# Patient Record
Sex: Female | Born: 1945
Health system: Southern US, Community
[De-identification: ages and names within clinical notes are randomized; demographics above are authoritative.]

## PROBLEM LIST (undated history)

## (undated) DIAGNOSIS — E7800 Pure hypercholesterolemia, unspecified: Secondary | ICD-10-CM

## (undated) DIAGNOSIS — I10 Essential (primary) hypertension: Secondary | ICD-10-CM

## (undated) DIAGNOSIS — E079 Disorder of thyroid, unspecified: Secondary | ICD-10-CM

## (undated) HISTORY — PX: CHOLECYSTECTOMY: SHX55

## (undated) HISTORY — PX: FUNCTIONAL ENDOSCOPIC SINUS SURGERY: SUR616

## (undated) HISTORY — PX: BILATERAL OOPHORECTOMY: SHX1221

## (undated) HISTORY — PX: OTHER SURGICAL HISTORY: SHX169

## (undated) HISTORY — PX: HERNIA REPAIR: SHX51

## (undated) HISTORY — PX: TONSILLECTOMY: SUR1361

## (undated) HISTORY — PX: ABDOMINAL HYSTERECTOMY: SHX81

---

## 2010-11-20 ENCOUNTER — Inpatient Hospital Stay (INDEPENDENT_AMBULATORY_CARE_PROVIDER_SITE_OTHER)
Admission: RE | Admit: 2010-11-20 | Discharge: 2010-11-20 | Disposition: A | Discharge status: Home / Self Care | Payer: Commercial Managed Care - PPO | Source: Ambulatory Visit | Attending: Family Medicine | Admitting: Family Medicine

## 2010-11-20 DIAGNOSIS — H6690 Otitis media, unspecified, unspecified ear: Secondary | ICD-10-CM

## 2011-09-07 ENCOUNTER — Emergency Department (INDEPENDENT_AMBULATORY_CARE_PROVIDER_SITE_OTHER)
Admission: EM | Admit: 2011-09-07 | Discharge: 2011-09-07 | Disposition: A | Discharge status: Home / Self Care | Payer: 59 | Source: Home / Self Care | Attending: Family Medicine | Admitting: Family Medicine

## 2011-09-07 ENCOUNTER — Emergency Department (INDEPENDENT_AMBULATORY_CARE_PROVIDER_SITE_OTHER): Payer: 59

## 2011-09-07 DIAGNOSIS — S93609A Unspecified sprain of unspecified foot, initial encounter: Secondary | ICD-10-CM

## 2011-09-07 HISTORY — DX: Pure hypercholesterolemia, unspecified: E78.00

## 2011-09-07 HISTORY — DX: Disorder of thyroid, unspecified: E07.9

## 2011-09-07 HISTORY — DX: Essential (primary) hypertension: I10

## 2011-09-07 MED ORDER — HYDROCODONE-ACETAMINOPHEN 5-325 MG PO TABS
ORAL_TABLET | ORAL | Status: AC
  Filled 2011-09-07: qty 2

## 2011-09-07 MED ORDER — HYDROCODONE-ACETAMINOPHEN 5-325 MG PO TABS: 1.0000 | ORAL_TABLET | Freq: Four times a day (QID) | ORAL | Status: AC | PRN

## 2011-09-07 MED ORDER — HYDROCODONE-ACETAMINOPHEN 5-325 MG PO TABS
2.0000 | ORAL_TABLET | Freq: Once | ORAL | Status: AC
  Administered 2011-09-07: 2 via ORAL

## 2011-09-07 MED ORDER — HYDROCODONE-ACETAMINOPHEN 5-325 MG PO TABS: 1.0000 | ORAL_TABLET | Freq: Once | ORAL | Status: DC

## 2011-09-07 NOTE — ED Notes (Signed)
States that earlier today, she slipped in grass , and twisted her left foot/ankle ; c/o pain foot from ankle/foot , good dorsal pulse, ice pack applied , swelling lateral and medical aspect of ankle

## 2011-09-07 NOTE — ED Notes (Signed)
Fitted w cam walker, demonstrated walking and unst of use; has family w her who will drive home

## 2011-09-07 NOTE — ED Provider Notes (Signed)
Medical screening examination/treatment/procedure(s) were performed by non-physician practitioner and as supervising physician I was immediately available for consultation/collaboration.   Barkley Bruns MD.    Barkley Bruns, MD 09/07/11 [phone]

## 2011-09-07 NOTE — ED Provider Notes (Signed)
History     CSN: [id number] Arrival date & time: 09/07/2011  2:06 PM   First MD Initiated Contact with Patient 09/07/11 1321      Chief Complaint  Patient presents with  . Foot Injury    (Consider location/radiation/quality/duration/timing/severity/associated sxs/prior treatment) Patient is a 65 y.o. female presenting with foot injury. The history is provided by the patient.  Foot Injury  The incident occurred 3 to 5 hours ago. The incident occurred at home. Injury mechanism: slid on grass and twisted foot laterally. The pain is present in the left foot. The quality of the pain is described as throbbing and aching. The pain is at a severity of 7/10. The pain is moderate. The pain has been constant since onset. Pertinent negatives include no numbness and no loss of sensation. The symptoms are aggravated by palpation, bearing weight and activity. She has tried nothing for the symptoms.    Past Medical History  Diagnosis Date  . Diabetes mellitus   . Hypertension   . Thyroid disease   . High cholesterol     History reviewed. No pertinent past surgical history.  History reviewed. No pertinent family history.  History  Substance Use Topics  . Smoking status: Never Smoker   . Smokeless tobacco: Not on file  . Alcohol Use: No    OB History    Grav Para Term Preterm Abortions TAB SAB Ect Mult Living                  Review of Systems  Constitutional: Negative for fever and chills.  Musculoskeletal:       L lateral foot pain  Skin: Negative for color change.  Neurological: Negative for weakness and numbness.    Allergies  Ciprofloxacin  Home Medications  No current outpatient prescriptions on file.  BP 181/82  Pulse 88  Temp(Src) 98.8 F (37.1 C) (Oral)  Resp 16  SpO2 97%  Physical Exam  Constitutional: She is oriented to person, place, and time. She appears well-developed and well-nourished. No distress.  HENT:  Head: Normocephalic and atraumatic.    Cardiovascular:  Pulses:      Dorsalis pedis pulses are 2+ on the left side.       Posterior tibial pulses are 2+ on the left side.  Pulmonary/Chest: Effort normal.  Musculoskeletal:       Left foot: She exhibits tenderness and swelling.       Feet:       No tenderness to palp L medial or lateral malleoli; area of pain is top of foot  Neurological: She is alert and oriented to person, place, and time. No sensory deficit.  Skin: No abrasion and no bruising noted.    ED Course  Procedures (including critical care time)  Labs Reviewed - No data to display No results found.   No diagnosis found.    MDM  No fx on x-ray, but pt c/o pain with movement of foot and ankle; requests cam walker and f/u with ortho;         Cathlyn Parsons, NP 09/07/11 [phone]

## 2011-11-06 ENCOUNTER — Emergency Department (INDEPENDENT_AMBULATORY_CARE_PROVIDER_SITE_OTHER)
Admission: EM | Admit: 2011-11-06 | Discharge: 2011-11-06 | Disposition: A | Discharge status: Home / Self Care | Payer: 59 | Source: Home / Self Care | Attending: Emergency Medicine | Admitting: Emergency Medicine

## 2011-11-06 ENCOUNTER — Encounter (HOSPITAL_COMMUNITY): Payer: Self-pay

## 2011-11-06 DIAGNOSIS — H6692 Otitis media, unspecified, left ear: Secondary | ICD-10-CM

## 2011-11-06 DIAGNOSIS — H6690 Otitis media, unspecified, unspecified ear: Secondary | ICD-10-CM

## 2011-11-06 DIAGNOSIS — J329 Chronic sinusitis, unspecified: Secondary | ICD-10-CM

## 2011-11-06 DIAGNOSIS — J45909 Unspecified asthma, uncomplicated: Secondary | ICD-10-CM

## 2011-11-06 MED ORDER — BECLOMETHASONE DIPROPIONATE 80 MCG/ACT IN AERS: 2.0000 | INHALATION_SPRAY | Freq: Two times a day (BID) | RESPIRATORY_TRACT | Status: DC

## 2011-11-06 MED ORDER — AMOXICILLIN-POT CLAVULANATE 875-125 MG PO TABS: 1.0000 | ORAL_TABLET | Freq: Two times a day (BID) | ORAL | Status: AC

## 2011-11-06 NOTE — ED Notes (Signed)
C/o head congestion, green nasal secretions and lt ear pain for 3 days.

## 2011-11-06 NOTE — ED Provider Notes (Signed)
Chief Complaint  Patient presents with  . Sinusitis    History of Present Illness:  Ronnette has had a three-day history of nasal congestion with green drainage with blood, sinus pressure, popping of the left ear, chills, and cough productive of green sputum. She denies any fever, sore throat, wheezing, chest pain, vomiting, or diarrhea. She does have a history of asthma and is on albuterol.  Review of Systems:  Other than noted above, the patient denies any of the following symptoms. Systemic:  No fever, chills, sweats, fatigue, myalgias, headache, or anorexia. Eye:  No redness, pain or drainage. ENT:  No earache, nasal congestion, rhinorrhea, sinus pressure, or sore throat. Lungs:  No cough, sputum production, wheezing, shortness of breath. Or chest pain. GI:  No nausea, vomiting, abdominal pain or diarrhea. Skin:  No rash or itching.  PMFSH:  Past medical history, family history, social history, meds, and allergies were reviewed.  Physical Exam:   Vital signs:  BP 138/81  Pulse 82  Temp(Src) 98.1 F (36.7 C) (Oral)  Resp 18  SpO2 97% General:  Alert, in no distress. Eye:  No conjunctival injection or drainage. ENT:  The left TM was pink, the right TM was normal. There was no fluid or pus behind either TM.  Nasal mucosa was clear and uncongested, without drainage.  Mucous membranes were moist.  Pharynx was clear, without exudate or drainage.  There were no oral ulcerations or lesions. Neck:  Supple, no adenopathy, tenderness or mass. Lungs:  No respiratory distress. She has scattered bilateral expiratory wheezes. No rales or rhonchi. Breath sounds are equal bilaterally.  Heart:  Regular rhythm, without gallops, murmers or rubs. Skin:  Clear, warm, and dry, without rash or lesions.  Labs:  No results found for this or any previous visit.   Radiology:  No results found.  Medications given in [facility]:    Assessment:   Diagnoses that have been ruled out:  None  Diagnoses that are  still under consideration:  None  Final diagnoses:  Sinusitis  Left otitis media  Asthma      Plan:   1.  The following meds were prescribed:   New Prescriptions   AMOXICILLIN-CLAVULANATE (AUGMENTIN) 875-125 MG PER TABLET    Take 1 tablet by mouth 2 (two) times daily.   BECLOMETHASONE (QVAR) 80 MCG/ACT INHALER    Inhale 2 puffs into the lungs 2 (two) times daily.   2.  The patient was instructed in symptomatic care and handouts were given. 3.  The patient was told to return if becoming worse in any way, if no better in 3 or 4 days, and given some red flag symptoms that would indicate earlier return.   Roque Lias, MD 11/06/11 [phone]

## 2013-01-19 ENCOUNTER — Emergency Department (HOSPITAL_COMMUNITY)
Admission: EM | Admit: 2013-01-19 | Discharge: 2013-01-19 | Disposition: A | Discharge status: Home / Self Care | Payer: PRIVATE HEALTH INSURANCE | Attending: Emergency Medicine | Admitting: Emergency Medicine

## 2013-01-19 ENCOUNTER — Emergency Department (HOSPITAL_COMMUNITY): Payer: PRIVATE HEALTH INSURANCE

## 2013-01-19 ENCOUNTER — Encounter (HOSPITAL_COMMUNITY): Payer: Self-pay | Admitting: Emergency Medicine

## 2013-01-19 DIAGNOSIS — E079 Disorder of thyroid, unspecified: Secondary | ICD-10-CM | POA: Insufficient documentation

## 2013-01-19 DIAGNOSIS — R110 Nausea: Secondary | ICD-10-CM | POA: Insufficient documentation

## 2013-01-19 DIAGNOSIS — S0003XA Contusion of scalp, initial encounter: Secondary | ICD-10-CM | POA: Insufficient documentation

## 2013-01-19 DIAGNOSIS — Y9301 Activity, walking, marching and hiking: Secondary | ICD-10-CM | POA: Insufficient documentation

## 2013-01-19 DIAGNOSIS — E7800 Pure hypercholesterolemia, unspecified: Secondary | ICD-10-CM | POA: Insufficient documentation

## 2013-01-19 DIAGNOSIS — I10 Essential (primary) hypertension: Secondary | ICD-10-CM | POA: Insufficient documentation

## 2013-01-19 DIAGNOSIS — Y990 Civilian activity done for income or pay: Secondary | ICD-10-CM | POA: Insufficient documentation

## 2013-01-19 DIAGNOSIS — J45909 Unspecified asthma, uncomplicated: Secondary | ICD-10-CM | POA: Insufficient documentation

## 2013-01-19 DIAGNOSIS — R739 Hyperglycemia, unspecified: Secondary | ICD-10-CM

## 2013-01-19 DIAGNOSIS — IMO0002 Reserved for concepts with insufficient information to code with codable children: Secondary | ICD-10-CM | POA: Insufficient documentation

## 2013-01-19 DIAGNOSIS — Z79899 Other long term (current) drug therapy: Secondary | ICD-10-CM | POA: Insufficient documentation

## 2013-01-19 DIAGNOSIS — Z7982 Long term (current) use of aspirin: Secondary | ICD-10-CM | POA: Insufficient documentation

## 2013-01-19 DIAGNOSIS — E1169 Type 2 diabetes mellitus with other specified complication: Secondary | ICD-10-CM | POA: Insufficient documentation

## 2013-01-19 DIAGNOSIS — S0093XA Contusion of unspecified part of head, initial encounter: Secondary | ICD-10-CM

## 2013-01-19 DIAGNOSIS — E876 Hypokalemia: Secondary | ICD-10-CM | POA: Insufficient documentation

## 2013-01-19 DIAGNOSIS — Y9289 Other specified places as the place of occurrence of the external cause: Secondary | ICD-10-CM | POA: Insufficient documentation

## 2013-01-19 LAB — POCT I-STAT, CHEM 8
BUN: 15 mg/dL (ref 6–23)
Chloride: 103 mEq/L (ref 96–112)
HCT: 41 % (ref 36.0–46.0)
Potassium: 3.3 mEq/L — ABNORMAL LOW (ref 3.5–5.1)
Sodium: 141 mEq/L (ref 135–145)

## 2013-01-19 MED ORDER — POTASSIUM CHLORIDE CRYS ER 20 MEQ PO TBCR
40.0000 meq | EXTENDED_RELEASE_TABLET | Freq: Once | ORAL | Status: AC
  Administered 2013-01-19: 40 meq via ORAL
  Filled 2013-01-19: qty 2

## 2013-01-19 NOTE — ED Notes (Signed)
Pt was walking into work and states that she was walking under a rail and hit her head while going under and fell had brief loc, alert x4 now did have emesis, headache

## 2013-01-19 NOTE — ED Provider Notes (Signed)
History     CSN: [id number]  Arrival date & time 01/19/13  4098   First MD Initiated Contact with Patient 01/19/13 [phone]      Chief Complaint  Patient presents with  . Fall    (Consider location/radiation/quality/duration/timing/severity/associated sxs/prior treatment) Patient is a 67 y.o. female presenting with fall. The history is provided by the patient.  Fall Associated symptoms include nausea and headaches. Pertinent negatives include no fever, no numbness and no abdominal pain.  pt was walking into work today, walked under a concrete structure 'to take shortcut' and hit head on the structure, +brief loc. +headache post contusion/head injury. Dull, moderate. Was able to get up under own power and go into work, episode nausea ?dry heaves. Nausea better now. stateswhen awoke today felt at baseline. Ate, got ready for work per normal routine. Notes neck pain worse w turning head side to side. No radicular pain. No numbness/weakness. No back pain. Denies other pain or injury.     Past Medical History  Diagnosis Date  . Diabetes mellitus   . Hypertension   . Thyroid disease   . High cholesterol   . Asthma     Past Surgical History  Procedure Laterality Date  . Tonsillectomy    . Adnoidectomy    . Hernia repair    . Cholecystectomy    . Functional endoscopic sinus surgery    . Bilateral oophorectomy    . Abdominal hysterectomy      No family history on file.  History  Substance Use Topics  . Smoking status: Never Smoker   . Smokeless tobacco: Not on file  . Alcohol Use: No    OB History   Grav Para Term Preterm Abortions TAB SAB Ect Mult Living                  Review of Systems  Constitutional: Negative for fever.  HENT: Positive for neck pain.   Eyes: Negative for visual disturbance.  Respiratory: Negative for shortness of breath.   Cardiovascular: Negative for chest pain and palpitations.  Gastrointestinal: Positive for nausea. Negative for abdominal  pain and blood in stool.  Genitourinary: Negative for flank pain.  Musculoskeletal: Negative for back pain.  Skin: Negative for wound.  Neurological: Positive for headaches. Negative for weakness and numbness.  Hematological: Does not bruise/bleed easily.  Psychiatric/Behavioral: Negative for confusion.    Allergies  Ciprofloxacin and Iodine  Home Medications   Current Outpatient Rx  Name  Route  Sig  Dispense  Refill  . ALBUTEROL IN   Inhalation   Inhale into the lungs as needed.         . ASPIRIN PO   Oral   Take by mouth.         . EXPIRED: beclomethasone (QVAR) 80 MCG/ACT inhaler   Inhalation   Inhale 2 puffs into the lungs 2 (two) times daily.   1 Inhaler   0   . cholecalciferol (VITAMIN D) 1000 UNITS tablet   Oral   Take 1,000 Units by mouth daily.           . Ezetimibe (ZETIA PO)   Oral   Take by mouth.         . hydrochlorothiazide (MICROZIDE) 12.5 MG capsule   Oral   Take 12.5 mg by mouth daily.           Marland Kitchen levothyroxine (SYNTHROID, LEVOTHROID) 125 MCG tablet   Oral   Take 125 mcg by mouth daily.           Marland Kitchen  lisinopril (PRINIVIL,ZESTRIL) 10 MG tablet   Oral   Take 10 mg by mouth daily.           . metFORMIN (GLUCOPHAGE) 500 MG tablet   Oral   Take 500 mg by mouth 2 (two) times daily with a meal.           . Rosuvastatin Calcium (CRESTOR PO)   Oral   Take by mouth.         . simvastatin (ZOCOR) 20 MG tablet   Oral   Take 20 mg by mouth at bedtime.           . topiramate (TOPAMAX) 50 MG tablet   Oral   Take 50 mg by mouth 2 (two) times daily.             BP 169/80  Pulse 116  Temp(Src) 98.6 F (37 C) (Oral)  Resp 16  SpO2 97%  Physical Exam  Nursing note and vitals reviewed. Constitutional: She is oriented to person, place, and time. She appears well-developed and well-nourished. No distress.  HENT:  Contusion scalp  Eyes: Conjunctivae are normal. Pupils are equal, round, and reactive to light. No scleral  icterus.  Neck: Neck supple. No tracheal deviation present.  Cardiovascular: Normal rate, regular rhythm, normal heart sounds and intact distal pulses.   Pulmonary/Chest: Effort normal and breath sounds normal. No respiratory distress. She exhibits no tenderness.  Abdominal: Soft. Normal appearance and bowel sounds are normal. She exhibits no distension. There is no tenderness.  Musculoskeletal: She exhibits no edema and no tenderness.  Mid cervical tenderness, otherwise CTLS spine, non tender, aligned, no step off.   Neurological: She is alert and oriented to person, place, and time.  Steady gait.   Skin: Skin is warm and dry. No rash noted.  Psychiatric: She has a normal mood and affect.    ED Course  Procedures (including critical care time)  Results for orders placed during the hospital encounter of 01/19/13  POCT I-STAT, CHEM 8      Result Value Range   Sodium 141  135 - 145 mEq/L   Potassium 3.3 (*) 3.5 - 5.1 mEq/L   Chloride 103  96 - 112 mEq/L   BUN 15  6 - 23 mg/dL   Creatinine, Ser 4.09  0.50 - 1.10 mg/dL   Glucose, Bld 811 (*) 70 - 99 mg/dL   Calcium, Ion 9.14 (*) 1.13 - 1.30 mmol/L   TCO2 27  0 - 100 mmol/L   Hemoglobin 13.9  12.0 - 15.0 g/dL   HCT 78.2  95.6 - 21.3 %   Ct Head Wo Contrast  01/19/2013  *RADIOLOGY REPORT*  Clinical Data:  Trauma with fall and loss of consciousness.  CT HEAD WITHOUT CONTRAST CT CERVICAL SPINE WITHOUT CONTRAST  Technique:  Multidetector CT imaging of the head and cervical spine was performed following the standard protocol without intravenous contrast.  Multiplanar CT image reconstructions of the cervical spine were also generated.  Comparison:   None  CT HEAD  Findings: The brain shows a background pattern of mild generalized atrophy and chronic appearing small vessel change within the white matter.  No sign of acute infarction, mass lesion, hemorrhage, hydrocephalus or extra-axial collection.  No skull fracture.  No fluid in the sinuses,  middle ears or mastoids.  There is an old medial wall orbital fracture on the right.  IMPRESSION: No acute finding.  Atrophy and chronic small vessel change of the white matter.  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 There is no fracture or malalignment in the cervical region.  Very minimal degenerative spondylosis at C5-6 without compressive narrowing of the canal or foramina.  Mild curvature convex to the left.  IMPRESSION: No acute or traumatic finding.  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   Original Report Authenticated By: Paulina Fusi, M.D.    Ct Cervical Spine Wo Contrast  01/19/2013  *RADIOLOGY REPORT*  Clinical Data:  Trauma with fall and loss of consciousness.  CT HEAD WITHOUT CONTRAST CT CERVICAL SPINE WITHOUT CONTRAST  Technique:  Multidetector CT imaging of the head and cervical spine was performed following the standard protocol without intravenous contrast.  Multiplanar CT image reconstructions of the cervical spine were also generated.  Comparison:   None  CT HEAD  Findings: The brain shows a background pattern of mild generalized atrophy and chronic appearing small vessel change within the white matter.  No sign of acute infarction, mass lesion, hemorrhage, hydrocephalus or extra-axial collection.  No skull fracture.  No fluid in the sinuses, middle ears or mastoids.  There is an old medial wall orbital fracture on the right.  IMPRESSION: No acute finding.  Atrophy and chronic small vessel change of the white matter.  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 There is no fracture or malalignment in the cervical region.  Very minimal degenerative spondylosis at C5-6 without compressive narrowing of the canal or foramina.  Mild curvature convex to the left.  IMPRESSION: No acute or traumatic finding.  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   Original Report Authenticated By: Paulina Fusi, M.D.         MDM  Cts.  Reviewed nursing notes and prior charts for additional history.    kcl po.  Discussed electrolyte/glucose results w pt.  Recheck pt comfortable. No nv. Declines any pain medication in ED.  Pt appears stable for d/c.          Suzi Roots, MD 01/19/13 1013

## 2014-06-25 IMAGING — CT CT CERVICAL SPINE W/O CM
4 of 6 series · 14 of 33 positions shown, 16 images · non-contrast
Comparison: None

CT HEAD

CLINICAL DATA: Trauma with fall and loss of consciousness.

CT HEAD WITHOUT CONTRAST
CT CERVICAL SPINE WITHOUT CONTRAST
TECHNIQUE: Multidetector CT imaging of the head and cervical spine
was performed following the standard protocol without intravenous
contrast.  Multiplanar CT image reconstructions of the cervical
spine were also generated.

[Series 5: c-spine st · axial · 0.23mm/px · z∈[-201,-121]mm · 3 of 81 slices shown, 4 images]
[im 21/81  soft-tissue]
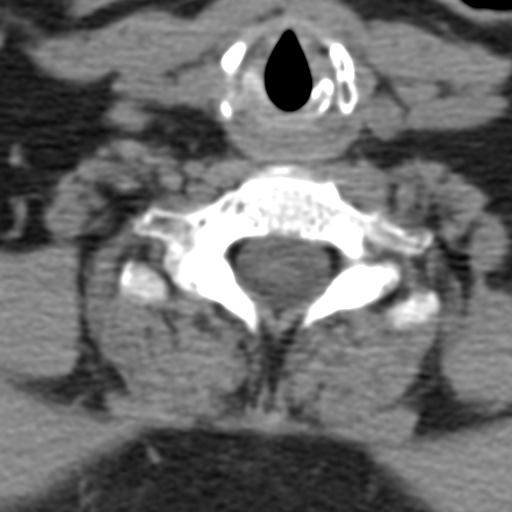
[im 21/81  bone]
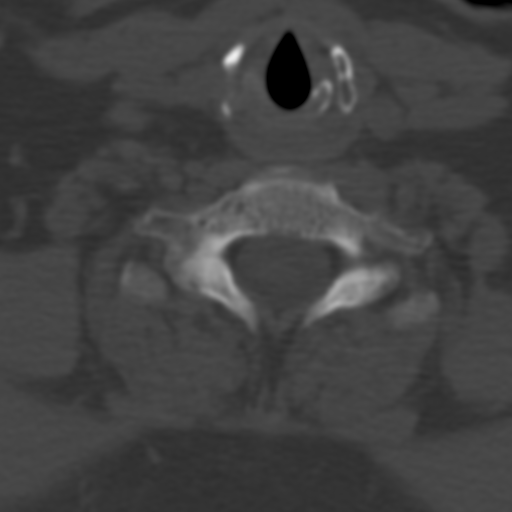
[im 41/81  bone]
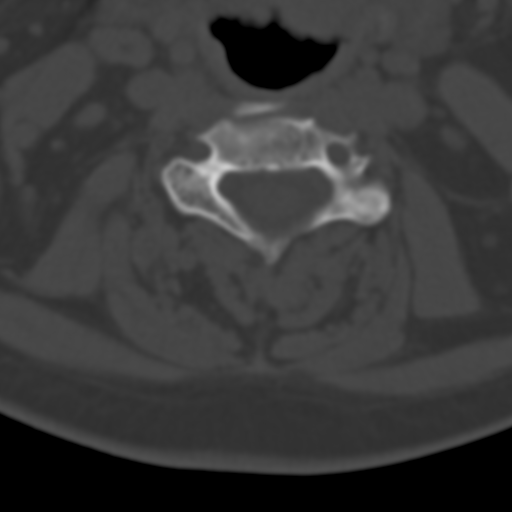
[im 61/81  bone]
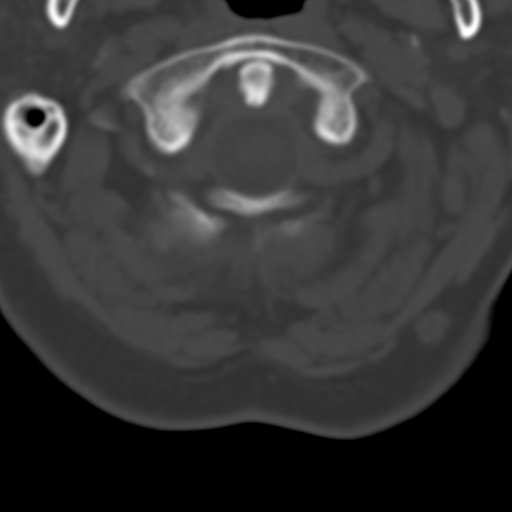

[Series 602: <mpr thick range> · coronal · 0.31mm/px · 3 of 46 slices shown]
[im 10/46  bone]
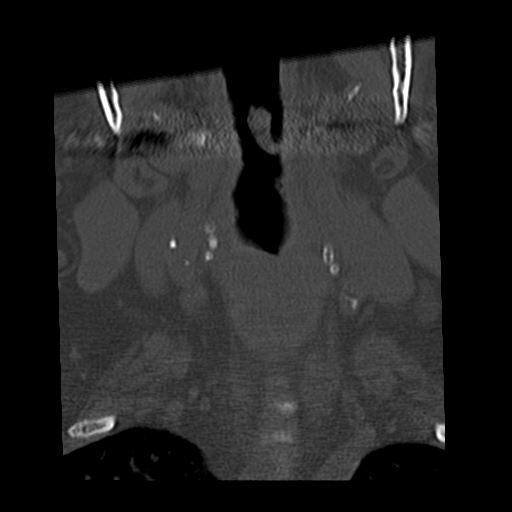
[im 19/46  bone]
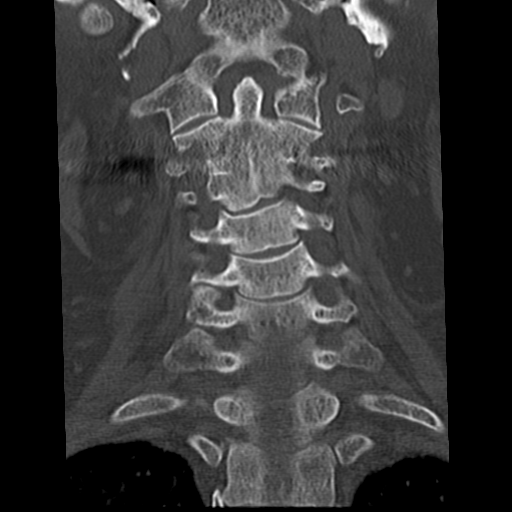
[im 28/46  bone]
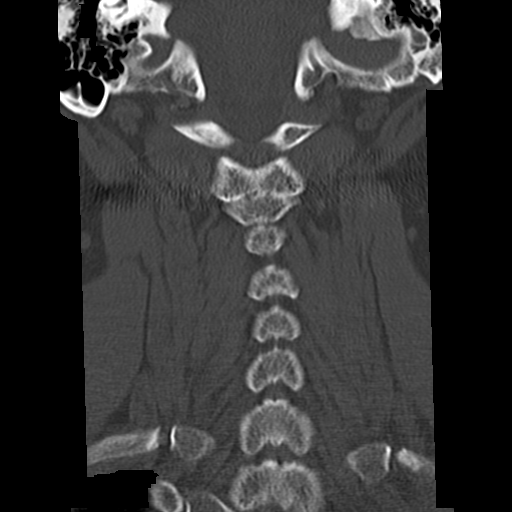

[Series 603: <mpr thick range(1)> · axial · 0.31mm/px · z∈[-231,-162]mm · 3 of 78 slices shown]
[im 20/78  bone]
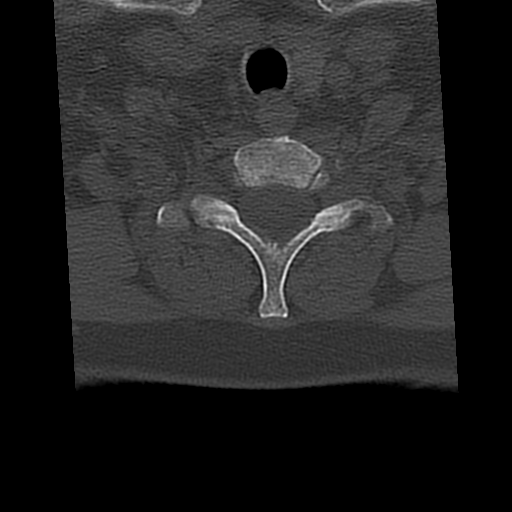
[im 39/78  bone]
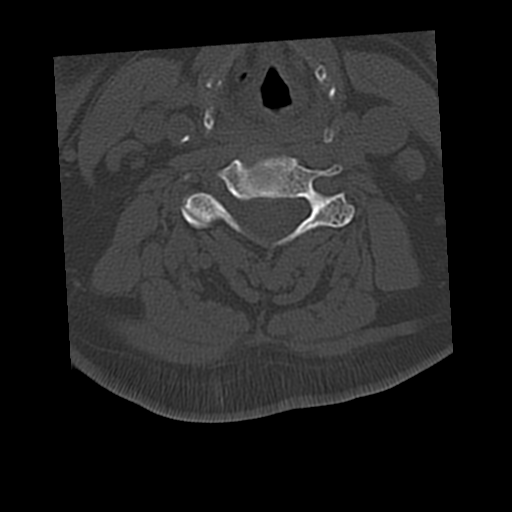
[im 58/78  bone]
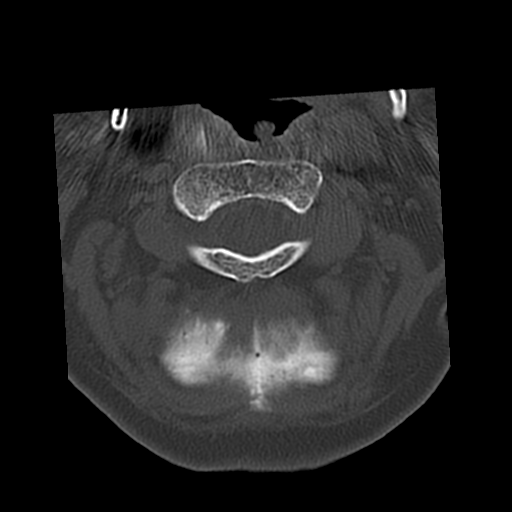

[Series 604: <mpr thick range(2)> · sagittal · 0.31mm/px · 5 of 41 slices shown, 6 images]
[im 14/41  bone]
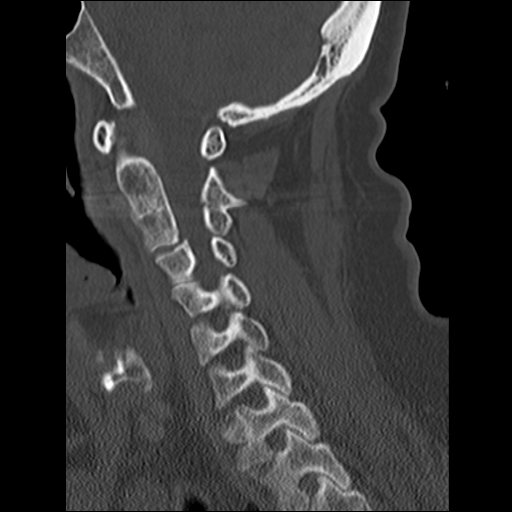
[im 17/41  bone]
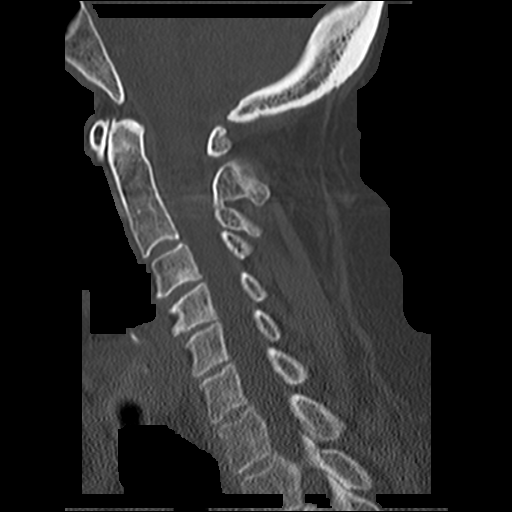
[im 21/41  soft-tissue]
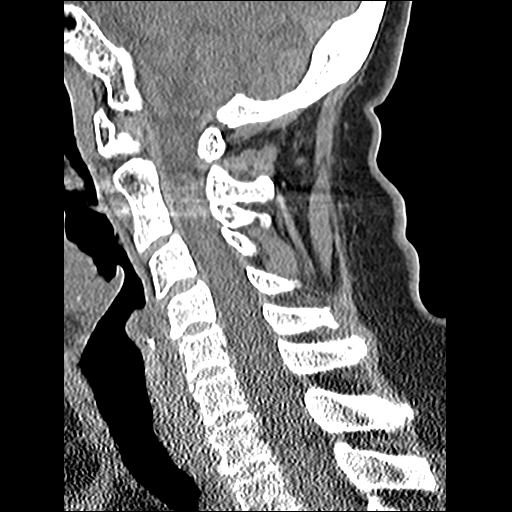
[im 21/41  bone]
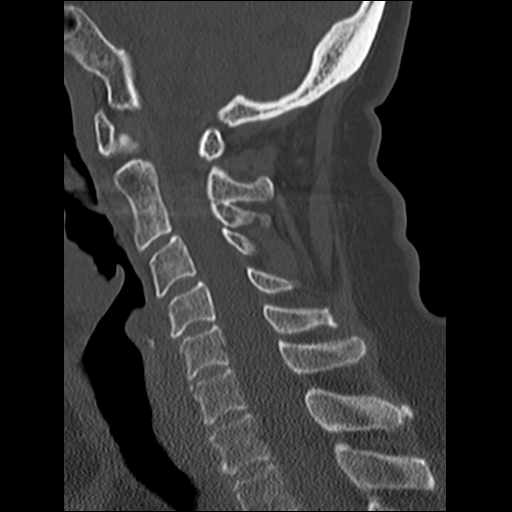
[im 24/41  bone]
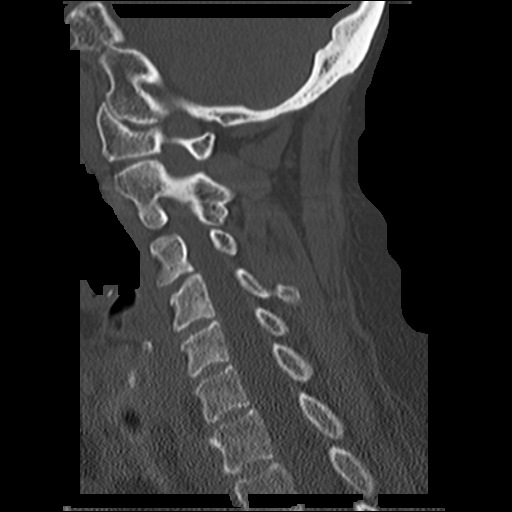
[im 27/41  bone]
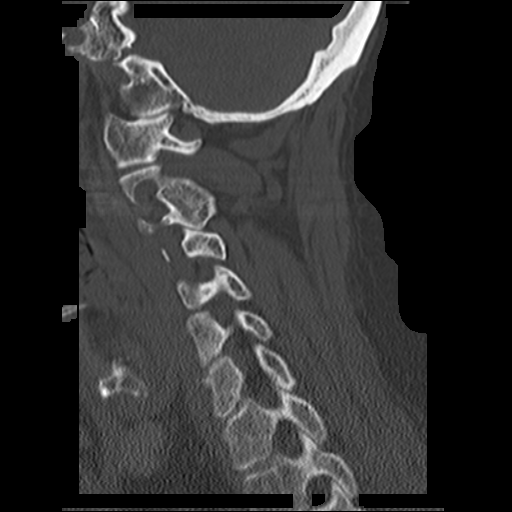

[14 of 33 positions shown; findings below may reference images not displayed]

FINDINGS: The brain shows a background pattern of mild generalized
atrophy and chronic appearing small vessel change within the white
matter.  No sign of acute infarction, mass lesion, hemorrhage,
hydrocephalus or extra-axial collection.  No skull fracture.  No
fluid in the sinuses, middle ears or mastoids.  There is an old
medial wall orbital fracture on the right.
IMPRESSION: No acute finding.  Atrophy and chronic small vessel change of the
white matter.

CT CERVICAL SPINE
FINDINGS: There is congenital failure of separation of C2 and C3.
There is no fracture or malalignment in the cervical region.  Very
minimal degenerative spondylosis at C5-6 without compressive
narrowing of the canal or foramina.  Mild curvature convex to the
left.
IMPRESSION: No acute or traumatic finding.  Congenital failure of separation of
C2 and C3.

## 2014-10-02 ENCOUNTER — Encounter (HOSPITAL_COMMUNITY): Payer: Self-pay | Admitting: Emergency Medicine

## 2014-10-02 ENCOUNTER — Emergency Department (HOSPITAL_COMMUNITY)
Admission: EM | Admit: 2014-10-02 | Discharge: 2014-10-02 | Disposition: A | Discharge status: Home / Self Care | Payer: 59 | Source: Home / Self Care | Attending: Emergency Medicine | Admitting: Emergency Medicine

## 2014-10-02 DIAGNOSIS — N390 Urinary tract infection, site not specified: Secondary | ICD-10-CM

## 2014-10-02 DIAGNOSIS — J00 Acute nasopharyngitis [common cold]: Secondary | ICD-10-CM

## 2014-10-02 LAB — POCT URINALYSIS DIP (DEVICE)
Bilirubin Urine: NEGATIVE
Glucose, UA: NEGATIVE mg/dL
Ketones, ur: NEGATIVE mg/dL
NITRITE: NEGATIVE
PH: 6.5 (ref 5.0–8.0)
PROTEIN: NEGATIVE mg/dL
Specific Gravity, Urine: <=1.005 (ref 1.005–1.030)
UROBILINOGEN UA: 0.2 mg/dL (ref 0.0–1.0)

## 2014-10-02 MED ORDER — NITROFURANTOIN MONOHYD MACRO 100 MG PO CAPS: 100.0000 mg | ORAL_CAPSULE | Freq: Two times a day (BID) | ORAL | Status: DC

## 2014-10-02 MED ORDER — ALBUTEROL SULFATE HFA 108 (90 BASE) MCG/ACT IN AERS: 1.0000 | INHALATION_SPRAY | Freq: Four times a day (QID) | RESPIRATORY_TRACT | Status: DC | PRN

## 2014-10-02 MED ORDER — BENZONATATE 100 MG PO CAPS: 100.0000 mg | ORAL_CAPSULE | Freq: Three times a day (TID) | ORAL | Status: DC | PRN

## 2014-10-02 NOTE — Discharge Instructions (Signed)
Urinary Tract Infection Urinary tract infections (UTIs) can develop anywhere along your urinary tract. Your urinary tract is your body's drainage system for removing wastes and extra water. Your urinary tract includes two kidneys, two ureters, a bladder, and a urethra. Your kidneys are a pair of bean-shaped organs. Each kidney is about the size of your fist. They are located below your ribs, one on each side of your spine. CAUSES Infections are caused by microbes, which are microscopic organisms, including fungi, viruses, and bacteria. These organisms are so small that they can only be seen through a microscope. Bacteria are the microbes that most commonly cause UTIs. SYMPTOMS  Symptoms of UTIs may vary by age and gender of the patient and by the location of the infection. Symptoms in young women typically include a frequent and intense urge to urinate and a painful, burning feeling in the bladder or urethra during urination. Older women and men are more likely to be tired, shaky, and weak and have muscle aches and abdominal pain. A fever may mean the infection is in your kidneys. Other symptoms of a kidney infection include pain in your back or sides below the ribs, nausea, and vomiting. DIAGNOSIS To diagnose a UTI, your caregiver will ask you about your symptoms. Your caregiver also will ask to provide a urine sample. The urine sample will be tested for bacteria and white blood cells. White blood cells are made by your body to help fight infection. TREATMENT  Typically, UTIs can be treated with medication. Because most UTIs are caused by a bacterial infection, they usually can be treated with the use of antibiotics. The choice of antibiotic and length of treatment depend on your symptoms and the type of bacteria causing your infection. HOME CARE INSTRUCTIONS  If you were prescribed antibiotics, take them exactly as your caregiver instructs you. Finish the medication even if you feel better after you  have only taken some of the medication.  Drink enough water and fluids to keep your urine clear or pale yellow.  Avoid caffeine, tea, and carbonated beverages. They tend to irritate your bladder.  Empty your bladder often. Avoid holding urine for long periods of time.  Empty your bladder before and after sexual intercourse.  After a bowel movement, women should cleanse from front to back. Use each tissue only once. SEEK MEDICAL CARE IF:   You have back pain.  You develop a fever.  Your symptoms do not begin to resolve within 3 days. SEEK IMMEDIATE MEDICAL CARE IF:   You have severe back pain or lower abdominal pain.  You develop chills.  You have nausea or vomiting.  You have continued burning or discomfort with urination. MAKE SURE YOU:   Understand these instructions.  Will watch your condition.  Will get help right away if you are not doing well or get worse. Document Released: 07/11/2005 Document Revised: 04/01/2012 Document Reviewed: 11/09/2011 [facility] Patient Information 2015 [place], [place]. This information is not intended to replace advice given to you by your health care provider. Make sure you discuss any questions you have with your health care provider.  Upper Respiratory Infection, Adult An upper respiratory infection (URI) is also sometimes known as the common cold. The upper respiratory tract includes the nose, sinuses, throat, trachea, and bronchi. Bronchi are the airways leading to the lungs. Most people improve within 1 week, but symptoms can last up to 2 weeks. A residual cough may last even longer.  CAUSES Many different viruses can infect the  tissues lining the upper respiratory tract. The tissues become irritated and inflamed and often become very moist. Mucus production is also common. A cold is contagious. You can easily spread the virus to others by oral contact. This includes kissing, sharing a glass, coughing, or sneezing. Touching your mouth or  nose and then touching a surface, which is then touched by another person, can also spread the virus. SYMPTOMS  Symptoms typically develop 1 to 3 days after you come in contact with a cold virus. Symptoms vary from person to person. They may include:  Runny nose.  Sneezing.  Nasal congestion.  Sinus irritation.  Sore throat.  Loss of voice (laryngitis).  Cough.  Fatigue.  Muscle aches.  Loss of appetite.  Headache.  Low-grade fever. DIAGNOSIS  You might diagnose your own cold based on familiar symptoms, since most people get a cold 2 to 3 times a year. Your caregiver can confirm this based on your exam. Most importantly, your caregiver can check that your symptoms are not due to another disease such as strep throat, sinusitis, pneumonia, asthma, or epiglottitis. Blood tests, throat tests, and X-rays are not necessary to diagnose a common cold, but they may sometimes be helpful in excluding other more serious diseases. Your caregiver will decide if any further tests are required. RISKS AND COMPLICATIONS  You may be at risk for a more severe case of the common cold if you smoke cigarettes, have chronic heart disease (such as heart failure) or lung disease (such as asthma), or if you have a weakened immune system. The very young and very old are also at risk for more serious infections. Bacterial sinusitis, middle ear infections, and bacterial pneumonia can complicate the common cold. The common cold can worsen asthma and chronic obstructive pulmonary disease (COPD). Sometimes, these complications can require emergency medical care and may be life-threatening. PREVENTION  The best way to protect against getting a cold is to practice good hygiene. Avoid oral or hand contact with people with cold symptoms. Wash your hands often if contact occurs. There is no clear evidence that vitamin C, vitamin E, echinacea, or exercise reduces the chance of developing a cold. However, it is always  recommended to get plenty of rest and practice good nutrition. TREATMENT  Treatment is directed at relieving symptoms. There is no cure. Antibiotics are not effective, because the infection is caused by a virus, not by bacteria. Treatment may include:  Increased fluid intake. Sports drinks offer valuable electrolytes, sugars, and fluids.  Breathing heated mist or steam (vaporizer or shower).  Eating chicken soup or other clear broths, and maintaining good nutrition.  Getting plenty of rest.  Using gargles or lozenges for comfort.  Controlling fevers with ibuprofen or acetaminophen as directed by your caregiver.  Increasing usage of your inhaler if you have asthma. Zinc gel and zinc lozenges, taken in the first 24 hours of the common cold, can shorten the duration and lessen the severity of symptoms. Pain medicines may help with fever, muscle aches, and throat pain. A variety of non-prescription medicines are available to treat congestion and runny nose. Your caregiver can make recommendations and may suggest nasal or lung inhalers for other symptoms.  HOME CARE INSTRUCTIONS   Only take over-the-counter or prescription medicines for pain, discomfort, or fever as directed by your caregiver.  Use a warm mist humidifier or inhale steam from a shower to increase air moisture. This may keep secretions moist and make it easier to breathe.  Drink enough  water and fluids to keep your urine clear or pale yellow.  Rest as needed.  Return to work when your temperature has returned to normal or as your caregiver advises. You may need to stay home longer to avoid infecting others. You can also use a face mask and careful hand washing to prevent spread of the virus. SEEK MEDICAL CARE IF:   After the first few days, you feel you are getting worse rather than better.  You need your caregiver's advice about medicines to control symptoms.  You develop chills, worsening shortness of breath, or brown  or red sputum. These may be signs of pneumonia.  You develop yellow or brown nasal discharge or pain in the face, especially when you bend forward. These may be signs of sinusitis.  You develop a fever, swollen neck glands, pain with swallowing, or white areas in the back of your throat. These may be signs of strep throat. SEEK IMMEDIATE MEDICAL CARE IF:   You have a fever.  You develop severe or persistent headache, ear pain, sinus pain, or chest pain.  You develop wheezing, a prolonged cough, cough up blood, or have a change in your usual mucus (if you have chronic lung disease).  You develop sore muscles or a stiff neck. Document Released: 03/27/2001 Document Revised: 12/24/2011 Document Reviewed: 01/06/2014 [facility] Patient Information 2015 [place], [place]. This information is not intended to replace advice given to you by your health care provider. Make sure you discuss any questions you have with your health care provider.

## 2014-10-02 NOTE — ED Provider Notes (Signed)
CSN: [id number]     Arrival date & time 10/02/14  1001 History   First MD Initiated Contact with Patient 10/02/14 1050     Chief Complaint  Patient presents with  . Urinary Tract Infection  . URI   (Consider location/radiation/quality/duration/timing/severity/associated sxs/prior Treatment) HPI Comments: Nonsmoker PCP: Varney Baas, NP at [place]  Patient is a 68 y.o. female presenting with urinary tract infection and URI. The history is provided by the patient.  Urinary Tract Infection This is a new problem. The current episode started 2 days ago. The problem has not changed since onset.Pertinent negatives include no chest pain. Associated symptoms comments: Dysuria, frequency, cloudy urine.  URI Presenting symptoms: congestion, cough, fever and rhinorrhea   Presenting symptoms: no fatigue and no sore throat   Severity:  Mild Onset quality:  Gradual Duration:  2 days Timing:  Constant Progression:  Unchanged Chronicity:  New Associated symptoms: wheezing   Risk factors: sick contacts   Risk factors comment:  +grandson ill with same   Past Medical History  Diagnosis Date  . Diabetes mellitus   . Hypertension   . Thyroid disease   . High cholesterol   . Asthma    Past Surgical History  Procedure Laterality Date  . Tonsillectomy    . Adnoidectomy    . Hernia repair    . Cholecystectomy    . Functional endoscopic sinus surgery    . Bilateral oophorectomy    . Abdominal hysterectomy     No family history on file. History  Substance Use Topics  . Smoking status: Never Smoker   . Smokeless tobacco: Not on file  . Alcohol Use: No   OB History    No data available     Review of Systems  Constitutional: Positive for fever. Negative for chills and fatigue.  HENT: Positive for congestion and rhinorrhea. Negative for sore throat.   Respiratory: Positive for cough and wheezing.   Cardiovascular: Negative for chest pain.  Gastrointestinal:  Negative.   Genitourinary: Positive for dysuria, urgency and frequency. Negative for hematuria.  Musculoskeletal: Negative for back pain.  Skin: Negative.     Allergies  Ciprofloxacin and Iodine  Home Medications   Prior to Admission medications   Medication Sig Start Date End Date Taking? Authorizing Provider  levothyroxine (SYNTHROID, LEVOTHROID) 125 MCG tablet Take 125 mcg by mouth daily.     Yes Historical Provider, MD  metFORMIN (GLUCOPHAGE) 500 MG tablet Take 500 mg by mouth 2 (two) times daily with a meal.     Yes Historical Provider, MD  simvastatin (ZOCOR) 20 MG tablet Take 20 mg by mouth at bedtime.     Yes Historical Provider, MD  albuterol (PROVENTIL HFA;VENTOLIN HFA) 108 (90 BASE) MCG/ACT inhaler Inhale 1-2 puffs into the lungs every 6 (six) hours as needed for wheezing or shortness of breath. 10/02/14   Audelia Hives Covey Baller, PA  ASPIRIN PO Take 81 mg by mouth every morning.     Historical Provider, MD  benzonatate (TESSALON) 100 MG capsule Take 1 capsule (100 mg total) by mouth 3 (three) times daily as needed for cough. 10/02/14   Audelia Hives Emila Steinhauser, PA  cholecalciferol (VITAMIN D) 1000 UNITS tablet Take 1,000 Units by mouth daily.      Historical Provider, MD  Ezetimibe (ZETIA PO) Take 10 mg by mouth every morning.     Historical Provider, MD  hydrochlorothiazide (MICROZIDE) 12.5 MG capsule Take 12.5 mg by mouth daily.  Historical Provider, MD  lisinopril (PRINIVIL,ZESTRIL) 10 MG tablet Take 10 mg by mouth daily.      Historical Provider, MD  nitrofurantoin, macrocrystal-monohydrate, (MACROBID) 100 MG capsule Take 1 capsule (100 mg total) by mouth 2 (two) times daily. 10/02/14   Audelia Hives Zohair Epp, PA  Rosuvastatin Calcium (CRESTOR PO) Take 10 mg by mouth every morning.     Historical Provider, MD  topiramate (TOPAMAX) 50 MG tablet Take 50 mg by mouth 2 (two) times daily as needed.     Historical Provider, MD   BP 108/84 mmHg  Pulse 66  Temp(Src) 98.3 F  (36.8 C) (Oral)  Resp 16  SpO2 96% Physical Exam  Constitutional: She is oriented to person, place, and time. She appears well-developed and well-nourished. No distress.  HENT:  Head: Normocephalic and atraumatic.  Right Ear: Hearing, tympanic membrane, external ear and ear canal normal.  Left Ear: Hearing, tympanic membrane, external ear and ear canal normal.  Nose: Nose normal.  Mouth/Throat: Uvula is midline, oropharynx is clear and moist and mucous membranes are normal.  Eyes: Conjunctivae are normal. No scleral icterus.  Neck: Normal range of motion. Neck supple.  Cardiovascular: Normal rate, regular rhythm and normal heart sounds.   Pulmonary/Chest: Effort normal and breath sounds normal. No respiratory distress. She has no wheezes.  Abdominal: Soft. Bowel sounds are normal. She exhibits no distension. There is no tenderness. There is no CVA tenderness.  Musculoskeletal: Normal range of motion.  Neurological: She is alert and oriented to person, place, and time.  Skin: Skin is warm and dry. No rash noted. No erythema.  Psychiatric: She has a normal mood and affect. Her behavior is normal.  Nursing note and vitals reviewed.   ED Course  Procedures (including critical care time) Labs Review Labs Reviewed  POCT URINALYSIS DIP (DEVICE) - Abnormal; Notable for the following:    Hgb urine dipstick TRACE (*)    Leukocytes, UA TRACE (*)    All other components within normal limits  URINE CULTURE    Imaging Review No results found.   MDM   1. Common cold   2. UTI (lower urinary tract infection)    Exam consistent with common cold. Delsym or tessalon for cough.  No hypoxia, fever or wheezing UA with +heme and +LE. Will send for C&S and initiate treatment with 5 day course of Macrobid and advise PCP follow up if no improvement   Lutricia Feil, PA 10/02/14 1115

## 2014-10-02 NOTE — ED Notes (Signed)
C/o poss UTI; reports she was treated for UTI 3 weeks ago; finished antibiotics Sx today include: urgency/frequency/cloudy urine/dysuria Denies abd pain, chills  Also c/o cold sx onset Thursday eve Sx include: productive cough, fevers, wheezing  Alert, no signs of acute distress.

## 2014-10-05 LAB — URINE CULTURE
Colony Count: 15000
Special Requests: NORMAL

## 2014-10-05 NOTE — ED Notes (Signed)
Urine culture: 15,000 colonies E. Coli. Pt. adequately treated with Macrobid. Roselyn Meier 10/05/2014

## 2015-02-23 ENCOUNTER — Emergency Department (HOSPITAL_COMMUNITY)
Admission: EM | Admit: 2015-02-23 | Discharge: 2015-02-23 | Disposition: A | Discharge status: Home / Self Care | Payer: 59 | Source: Home / Self Care | Attending: Emergency Medicine | Admitting: Emergency Medicine

## 2015-02-23 ENCOUNTER — Encounter (HOSPITAL_COMMUNITY): Payer: Self-pay

## 2015-02-23 DIAGNOSIS — W57XXXA Bitten or stung by nonvenomous insect and other nonvenomous arthropods, initial encounter: Secondary | ICD-10-CM

## 2015-02-23 DIAGNOSIS — T148 Other injury of unspecified body region: Secondary | ICD-10-CM

## 2015-02-23 MED ORDER — DOXYCYCLINE HYCLATE 100 MG PO CAPS: 100.0000 mg | ORAL_CAPSULE | Freq: Two times a day (BID) | ORAL | Status: DC

## 2015-02-23 NOTE — ED Provider Notes (Signed)
CSN: [id number]     Arrival date & time 02/23/15  1056 History   First MD Initiated Contact with Patient 02/23/15 1232     Chief Complaint  Patient presents with  . Skin Problem   (Consider location/radiation/quality/duration/timing/severity/associated sxs/prior Treatment) HPI She is a 69 year old woman here for tick bite. She states she got the bite on Monday night. She removed the tape. She has had a gradually worsening ring of erythema around the site of the bite. It is painful. No fevers.  Past Medical History  Diagnosis Date  . Diabetes mellitus   . Hypertension   . Thyroid disease   . High cholesterol   . Asthma    Past Surgical History  Procedure Laterality Date  . Tonsillectomy    . Adnoidectomy    . Hernia repair    . Cholecystectomy    . Functional endoscopic sinus surgery    . Bilateral oophorectomy    . Abdominal hysterectomy     History reviewed. No pertinent family history. History  Substance Use Topics  . Smoking status: Never Smoker   . Smokeless tobacco: Not on file  . Alcohol Use: No   OB History    No data available     Review of Systems As in history of present illness Allergies  Ciprofloxacin and Iodine  Home Medications   Prior to Admission medications   Medication Sig Start Date End Date Taking? Authorizing Provider  albuterol (PROVENTIL HFA;VENTOLIN HFA) 108 (90 BASE) MCG/ACT inhaler Inhale 1-2 puffs into the lungs every 6 (six) hours as needed for wheezing or shortness of breath. 10/02/14   Audelia Hives Presson, PA  ASPIRIN PO Take 81 mg by mouth every morning.     Historical Provider, MD  benzonatate (TESSALON) 100 MG capsule Take 1 capsule (100 mg total) by mouth 3 (three) times daily as needed for cough. 10/02/14   Audelia Hives Presson, PA  cholecalciferol (VITAMIN D) 1000 UNITS tablet Take 1,000 Units by mouth daily.      Historical Provider, MD  doxycycline (VIBRAMYCIN) 100 MG capsule Take 1 capsule (100 mg total) by mouth 2 (two)  times daily. 02/23/15   Melony Overly, MD  Ezetimibe (ZETIA PO) Take 10 mg by mouth every morning.     Historical Provider, MD  hydrochlorothiazide (MICROZIDE) 12.5 MG capsule Take 12.5 mg by mouth daily.      Historical Provider, MD  levothyroxine (SYNTHROID, LEVOTHROID) 125 MCG tablet Take 125 mcg by mouth daily.      Historical Provider, MD  lisinopril (PRINIVIL,ZESTRIL) 10 MG tablet Take 10 mg by mouth daily.      Historical Provider, MD  metFORMIN (GLUCOPHAGE) 500 MG tablet Take 500 mg by mouth 2 (two) times daily with a meal.      Historical Provider, MD  Rosuvastatin Calcium (CRESTOR PO) Take 10 mg by mouth every morning.     Historical Provider, MD  simvastatin (ZOCOR) 20 MG tablet Take 20 mg by mouth at bedtime.      Historical Provider, MD  topiramate (TOPAMAX) 50 MG tablet Take 50 mg by mouth 2 (two) times daily as needed.     Historical Provider, MD   BP 134/65 mmHg  Pulse 69  Temp(Src) 98.6 F (37 C) (Oral)  Resp 16  SpO2 95% Physical Exam  Constitutional: She is oriented to person, place, and time. She appears well-developed and well-nourished. No distress.  Cardiovascular: Normal rate.   Pulmonary/Chest: Effort normal.  Neurological: She is alert  and oriented to person, place, and time.  Skin:  Insect bite on left upper arm. There is central induration with a surrounding ring of erythema.    ED Course  Procedures (including critical care time) Labs Review Labs Reviewed - No data to display  Imaging Review No results found.   MDM   1. Tick bite    We'll treat with doxycycline for 10 days. Follow-up as needed.    Melony Overly, MD 02/23/15 1316

## 2015-02-23 NOTE — Discharge Instructions (Signed)
Take doxycycline 1 pill twice a day for 10 days. This will cover a skin infection as well as Lyme disease or [facility] spotted fever. Follow-up as needed.

## 2015-02-23 NOTE — ED Notes (Signed)
C/o swollen , red area left upper arm from tick bite

## 2015-10-26 MED FILL — ONE TOUCH DELICA 33G LANCET: 30 days supply | Qty: 100 | Fill #0

## 2015-10-26 MED FILL — ONETOUCH VERIO TEST STRIP: 30 days supply | Qty: 100 | Fill #0

## 2015-11-10 MED FILL — VIBERZI 75 MG TABLET: 75 | 30 days supply | Qty: 60 | Fill #2

## 2015-12-13 MED FILL — metFORMIN HCL 500 MG TABS: 500 | 90 days supply | Qty: 180 | Fill #2

## 2015-12-13 MED FILL — ONETOUCH VERIO TEST STRIP: 30 days supply | Qty: 100 | Fill #1

## 2015-12-13 MED FILL — VIBERZI 75 MG TABLET: 75 | 30 days supply | Qty: 60 | Fill #3

## 2015-12-13 MED FILL — EZETIMIBE 10 MG TABLET: 10 | 30 days supply | Qty: 30 | Fill #2

## 2015-12-27 DIAGNOSIS — E119 Type 2 diabetes mellitus without complications: Secondary | ICD-10-CM | POA: Diagnosis not present

## 2015-12-27 DIAGNOSIS — E559 Vitamin D deficiency, unspecified: Secondary | ICD-10-CM | POA: Diagnosis not present

## 2015-12-27 DIAGNOSIS — E038 Other specified hypothyroidism: Secondary | ICD-10-CM | POA: Diagnosis not present

## 2015-12-27 DIAGNOSIS — E782 Mixed hyperlipidemia: Secondary | ICD-10-CM | POA: Diagnosis not present

## 2015-12-29 DIAGNOSIS — E119 Type 2 diabetes mellitus without complications: Secondary | ICD-10-CM | POA: Diagnosis not present

## 2015-12-29 DIAGNOSIS — E038 Other specified hypothyroidism: Secondary | ICD-10-CM | POA: Diagnosis not present

## 2015-12-29 DIAGNOSIS — E782 Mixed hyperlipidemia: Secondary | ICD-10-CM | POA: Diagnosis not present

## 2015-12-29 DIAGNOSIS — E559 Vitamin D deficiency, unspecified: Secondary | ICD-10-CM | POA: Diagnosis not present

## 2015-12-29 DIAGNOSIS — I10 Essential (primary) hypertension: Secondary | ICD-10-CM | POA: Diagnosis not present

## 2016-01-10 MED FILL — VIBERZI 75 MG TABLET: 75 | 90 days supply | Qty: 180 | Fill #0

## 2016-01-13 DIAGNOSIS — I10 Essential (primary) hypertension: Secondary | ICD-10-CM | POA: Diagnosis not present

## 2016-01-13 DIAGNOSIS — E038 Other specified hypothyroidism: Secondary | ICD-10-CM | POA: Diagnosis not present

## 2016-01-13 DIAGNOSIS — E559 Vitamin D deficiency, unspecified: Secondary | ICD-10-CM | POA: Diagnosis not present

## 2016-01-13 DIAGNOSIS — E119 Type 2 diabetes mellitus without complications: Secondary | ICD-10-CM | POA: Diagnosis not present

## 2016-01-13 DIAGNOSIS — E782 Mixed hyperlipidemia: Secondary | ICD-10-CM | POA: Diagnosis not present

## 2016-01-30 DIAGNOSIS — H5203 Hypermetropia, bilateral: Secondary | ICD-10-CM | POA: Diagnosis not present

## 2016-02-17 MED FILL — BENAZEPRIL HCL 20 MG TABLET: 20 | 90 days supply | Qty: 90 | Fill #1

## 2016-02-17 MED FILL — HYDROCHLOROTHIAZIDE 12.5 MG: 12.5 | 90 days supply | Qty: 90 | Fill #2

## 2016-02-17 MED FILL — EZETIMIBE 10 MG TABLET: 10 | 30 days supply | Qty: 30 | Fill #3

## 2016-02-17 MED FILL — METOPROLOL SUCC ER 50 MG TA: 50 | 90 days supply | Qty: 90 | Fill #0

## 2016-02-20 MED FILL — metFORMIN HCL 500 MG TABS: 500 | 90 days supply | Qty: 360 | Fill #0

## 2016-03-06 MED FILL — SYNTHROID 150 MCG TABLET: 150 | 90 days supply | Qty: 100 | Fill #1

## 2016-04-03 DIAGNOSIS — E038 Other specified hypothyroidism: Secondary | ICD-10-CM | POA: Diagnosis not present

## 2016-04-03 DIAGNOSIS — E782 Mixed hyperlipidemia: Secondary | ICD-10-CM | POA: Diagnosis not present

## 2016-04-03 DIAGNOSIS — E119 Type 2 diabetes mellitus without complications: Secondary | ICD-10-CM | POA: Diagnosis not present

## 2016-04-03 DIAGNOSIS — E559 Vitamin D deficiency, unspecified: Secondary | ICD-10-CM | POA: Diagnosis not present

## 2016-04-05 DIAGNOSIS — E119 Type 2 diabetes mellitus without complications: Secondary | ICD-10-CM | POA: Diagnosis not present

## 2016-04-05 DIAGNOSIS — E782 Mixed hyperlipidemia: Secondary | ICD-10-CM | POA: Diagnosis not present

## 2016-04-05 DIAGNOSIS — E559 Vitamin D deficiency, unspecified: Secondary | ICD-10-CM | POA: Diagnosis not present

## 2016-04-05 DIAGNOSIS — E038 Other specified hypothyroidism: Secondary | ICD-10-CM | POA: Diagnosis not present

## 2016-04-05 DIAGNOSIS — I10 Essential (primary) hypertension: Secondary | ICD-10-CM | POA: Diagnosis not present

## 2016-05-03 DIAGNOSIS — L570 Actinic keratosis: Secondary | ICD-10-CM | POA: Diagnosis not present

## 2016-05-03 DIAGNOSIS — L821 Other seborrheic keratosis: Secondary | ICD-10-CM | POA: Diagnosis not present

## 2016-05-04 MED FILL — FLUOROURACIL 5% CREAM: 5 | 21 days supply | Qty: 40 | Fill #0

## 2016-05-04 MED FILL — EZETIMIBE 10 MG TABLET: 10 | 90 days supply | Qty: 90 | Fill #4

## 2016-06-20 ENCOUNTER — Ambulatory Visit (HOSPITAL_COMMUNITY)
Admission: EM | Admit: 2016-06-20 | Discharge: 2016-06-20 | Disposition: A | Discharge status: Home / Self Care | Payer: 59 | Attending: Emergency Medicine | Admitting: Emergency Medicine

## 2016-06-20 ENCOUNTER — Encounter (HOSPITAL_COMMUNITY): Payer: Self-pay | Admitting: Emergency Medicine

## 2016-06-20 DIAGNOSIS — R300 Dysuria: Secondary | ICD-10-CM | POA: Diagnosis not present

## 2016-06-20 DIAGNOSIS — N390 Urinary tract infection, site not specified: Secondary | ICD-10-CM

## 2016-06-20 LAB — POCT URINALYSIS DIP (DEVICE)
BILIRUBIN URINE: NEGATIVE
Glucose, UA: NEGATIVE mg/dL
Ketones, ur: NEGATIVE mg/dL
NITRITE: NEGATIVE
PH: 5.5 (ref 5.0–8.0)
PROTEIN: 30 mg/dL — AB
Specific Gravity, Urine: >=1.03 (ref 1.005–1.030)
Urobilinogen, UA: 0.2 mg/dL (ref 0.0–1.0)

## 2016-06-20 MED ORDER — NITROFURANTOIN MONOHYD MACRO 100 MG PO CAPS: 100.0000 mg | ORAL_CAPSULE | Freq: Two times a day (BID) | ORAL | 0 refills | Status: DC

## 2016-06-20 MED ORDER — PHENAZOPYRIDINE HCL 200 MG PO TABS: 200.0000 mg | ORAL_TABLET | Freq: Three times a day (TID) | ORAL | 0 refills | Status: DC | PRN

## 2016-06-20 NOTE — Discharge Instructions (Signed)
Drink plenty of extra water to help flush your kidneys. Make sure that you  do not hold your urine, but go when you need to go.

## 2016-06-20 NOTE — ED Triage Notes (Signed)
History of uti.  Complains of urgency, frequency and burning with urination

## 2016-06-20 NOTE — ED Provider Notes (Signed)
HPI  SUBJECTIVE:  Shelby Allen is a 70 y.o. female who presents with 1 week of dysuria, urgency, frequency, cloudy and odorous urine. She has tried taking baths and Monistat. She states that these help. Monistat helps for several minutes. Symptoms are worse with urination. No nausea, vomiting, fevers, abdominal pain, pelvic pain, back pain. No abdominal distention. No vaginal bleeding, rash, vaginal itching, discharge, vaginal odor. No antibiotics in the past month. She states that she has not been sexually active in 20 years. No antipyretic in the past 6-8 hours. She states that the symptoms are identical to her previous UTI in 2015. She has a past medical history of diabetes, states that her glucose has been running within normal limits. Also history of hypertension, UTI, nephrolithiasis, nonobstructing. No history of pyelonephritis. PMD: Dr. Yvonne Kendall.    Past Medical History:  Diagnosis Date  . Asthma   . Diabetes mellitus   . High cholesterol   . Hypertension   . Thyroid disease     Past Surgical History:  Procedure Laterality Date  . ABDOMINAL HYSTERECTOMY    . adnoidectomy    . BILATERAL OOPHORECTOMY    . CHOLECYSTECTOMY    . FUNCTIONAL ENDOSCOPIC SINUS SURGERY    . HERNIA REPAIR    . TONSILLECTOMY      No family history on file.  Social History  Substance Use Topics  . Smoking status: Never Smoker  . Smokeless tobacco: Not on file  . Alcohol use No    No current facility-administered medications for this encounter.   Current Outpatient Prescriptions:  .  albuterol (PROVENTIL HFA;VENTOLIN HFA) 108 (90 BASE) MCG/ACT inhaler, Inhale 1-2 puffs into the lungs every 6 (six) hours as needed for wheezing or shortness of breath., Disp: 1 Inhaler, Rfl: 0 .  ASPIRIN PO, Take 81 mg by mouth every morning. , Disp: , Rfl:  .  benzonatate (TESSALON) 100 MG capsule, Take 1 capsule (100 mg total) by mouth 3 (three) times daily as needed for cough., Disp: 21 capsule, Rfl: 0 .   cholecalciferol (VITAMIN D) 1000 UNITS tablet, Take 1,000 Units by mouth daily.  , Disp: , Rfl:  .  doxycycline (VIBRAMYCIN) 100 MG capsule, Take 1 capsule (100 mg total) by mouth 2 (two) times daily., Disp: 20 capsule, Rfl: 0 .  Ezetimibe (ZETIA PO), Take 10 mg by mouth every morning. , Disp: , Rfl:  .  hydrochlorothiazide (MICROZIDE) 12.5 MG capsule, Take 12.5 mg by mouth daily.  , Disp: , Rfl:  .  levothyroxine (SYNTHROID, LEVOTHROID) 125 MCG tablet, Take 125 mcg by mouth daily.  , Disp: , Rfl:  .  lisinopril (PRINIVIL,ZESTRIL) 10 MG tablet, Take 10 mg by mouth daily.  , Disp: , Rfl:  .  metFORMIN (GLUCOPHAGE) 500 MG tablet, Take 500 mg by mouth 2 (two) times daily with a meal.  , Disp: , Rfl:  .  nitrofurantoin, macrocrystal-monohydrate, (MACROBID) 100 MG capsule, Take 1 capsule (100 mg total) by mouth 2 (two) times daily. X 5 days, Disp: 10 capsule, Rfl: 0 .  phenazopyridine (PYRIDIUM) 200 MG tablet, Take 1 tablet (200 mg total) by mouth 3 (three) times daily as needed for pain., Disp: 6 tablet, Rfl: 0 .  Rosuvastatin Calcium (CRESTOR PO), Take 10 mg by mouth every morning. , Disp: , Rfl:  .  simvastatin (ZOCOR) 20 MG tablet, Take 20 mg by mouth at bedtime.  , Disp: , Rfl:  .  topiramate (TOPAMAX) 50 MG tablet, Take 50 mg by mouth 2 (  two) times daily as needed. , Disp: , Rfl:   Allergies  Allergen Reactions  . Ciprofloxacin   . Iodine      ROS  As noted in HPI.   Physical Exam  BP 147/84 (BP Location: Right Arm)   Pulse 89   Temp 98.8 F (37.1 C) (Oral)   Resp 18   SpO2 95%   Constitutional: Well developed, well nourished, no acute distress Eyes:  EOMI, conjunctiva normal bilaterally HENT: Normocephalic, atraumatic,mucus membranes moist Respiratory: Normal inspiratory effort Cardiovascular: Normal rate GI: nondistended Soft, nontender. No suprapubic or flank tenderness Back: No CVA tenderness GU: Deferred skin: No rash, skin intact Musculoskeletal: no  deformities Neurologic: Alert & oriented x 3, no focal neuro deficits Psychiatric: Speech and behavior appropriate   ED Course   Medications - No data to display  Orders Placed This Encounter  Procedures  . Urine culture    Standing Status:   Standing    Number of Occurrences:   1    Order Specific Question:   List patient's active antibiotics    Answer:   macrobid  . POCT urinalysis dip (device)    Standing Status:   Standing    Number of Occurrences:   1    Results for orders placed or performed during the hospital encounter of 06/20/16 (from the past 24 hour(s))  POCT urinalysis dip (device)     Status: Abnormal   Collection Time: 06/20/16 11:17 AM  Result Value Ref Range   Glucose, UA NEGATIVE NEGATIVE mg/dL   Bilirubin Urine NEGATIVE NEGATIVE   Ketones, ur NEGATIVE NEGATIVE mg/dL   Specific Gravity, Urine >=1.030 1.005 - 1.030   Hgb urine dipstick MODERATE (A) NEGATIVE   pH 5.5 5.0 - 8.0   Protein, ur 30 (A) NEGATIVE mg/dL   Urobilinogen, UA 0.2 0.0 - 1.0 mg/dL   Nitrite NEGATIVE NEGATIVE   Leukocytes, UA LARGE (A) NEGATIVE   No results found.  ED Clinical Impression  UTI (lower urinary tract infection)   ED Assessment/Plan  Previous labs reviewed. Patient has history of Escherichia coli UTI that was resistant to Cipro and Levaquin, but otherwise sensitive to Keflex, Macrobid, Bactrim.  UA suggestive of UTI. Home with Macrobid, Pyridium and advised to increase fluids. Willl send urine off culture to confirm antibiotic choice.  Follow up with PMD as needed.  Discussed labs,  MDM, plan and followup with patient. Discussed sn/sx that should prompt return to the ED. Patient agrees with plan.   *This clinic note was created using Dragon dictation software. Therefore, there may be occasional mistakes despite careful proofreading.  ?   Melynda Ripple, MD 06/20/16 1136

## 2016-06-20 NOTE — ED Notes (Signed)
Provided warm blanket and pillow

## 2016-06-20 NOTE — ED Notes (Signed)
Patient sent to bathroom to collect specimen

## 2016-06-21 LAB — URINE CULTURE

## 2016-06-26 ENCOUNTER — Telehealth (HOSPITAL_COMMUNITY): Payer: Self-pay | Admitting: Emergency Medicine

## 2016-06-26 NOTE — Telephone Encounter (Signed)
-----   Message from Sherlene Shams, MD sent at 06/24/2016 12:34 PM EDT ----- Clinical staff, please let patient know that urine culture does not clearly demonstrate a UTI.   Recheck or followup PCP/Michael Altheimer or GYN for further evaluation if symptoms persist.  LM

## 2016-06-26 NOTE — Telephone Encounter (Signed)
LM on pt's VM  Need to see how pt is doing and to give lab results from recent visit on 9/6 Also let pt know labs can be obtained from [facility]

## 2016-06-27 ENCOUNTER — Telehealth (HOSPITAL_COMMUNITY): Payer: Self-pay | Admitting: Emergency Medicine

## 2016-06-27 NOTE — Telephone Encounter (Signed)
LM on pt's VM Need to see how pt is doing and to give lab results from recent visit on 9/6 Also let pt know labs can be obtained from [facility]   Per Dr. Valere Dross  Clinical staff, please let patient know that urine culture does not clearly demonstrate a UTI.   Recheck or followup PCP/Michael Altheimer or GYN for further evaluation if symptoms persist.  LM

## 2016-07-16 DIAGNOSIS — E782 Mixed hyperlipidemia: Secondary | ICD-10-CM | POA: Diagnosis not present

## 2016-07-16 DIAGNOSIS — E559 Vitamin D deficiency, unspecified: Secondary | ICD-10-CM | POA: Diagnosis not present

## 2016-07-16 DIAGNOSIS — E119 Type 2 diabetes mellitus without complications: Secondary | ICD-10-CM | POA: Diagnosis not present

## 2016-07-16 DIAGNOSIS — E038 Other specified hypothyroidism: Secondary | ICD-10-CM | POA: Diagnosis not present

## 2016-07-18 MED FILL — metFORMIN HCL 500 MG TABS: 500 | 90 days supply | Qty: 360 | Fill #1

## 2016-07-18 MED FILL — SYNTHROID 150 MCG TABLET: 150 | 90 days supply | Qty: 100 | Fill #2

## 2016-07-19 DIAGNOSIS — E038 Other specified hypothyroidism: Secondary | ICD-10-CM | POA: Diagnosis not present

## 2016-07-19 DIAGNOSIS — E119 Type 2 diabetes mellitus without complications: Secondary | ICD-10-CM | POA: Diagnosis not present

## 2016-07-19 DIAGNOSIS — E559 Vitamin D deficiency, unspecified: Secondary | ICD-10-CM | POA: Diagnosis not present

## 2016-07-19 DIAGNOSIS — E782 Mixed hyperlipidemia: Secondary | ICD-10-CM | POA: Diagnosis not present

## 2016-07-19 DIAGNOSIS — I10 Essential (primary) hypertension: Secondary | ICD-10-CM | POA: Diagnosis not present

## 2016-07-19 MED FILL — ONE TOUCH DELICA 33G LANCET: 30 days supply | Qty: 100 | Fill #0

## 2016-07-19 MED FILL — BENAZEPRIL HCL 20 MG TABLET: 20 | 90 days supply | Qty: 90 | Fill #0

## 2016-07-19 MED FILL — HYDROCHLOROTHIAZIDE 12.5 MG: 12.5 | 90 days supply | Qty: 90 | Fill #0

## 2016-07-19 MED FILL — ONETOUCH VERIO TEST STRIP: 30 days supply | Qty: 100 | Fill #0

## 2016-07-24 MED FILL — METOPROLOL SUCC ER 50 MG TA: 50 | 90 days supply | Qty: 90 | Fill #0

## 2016-08-10 ENCOUNTER — Ambulatory Visit (HOSPITAL_COMMUNITY)
Admission: EM | Admit: 2016-08-10 | Discharge: 2016-08-10 | Disposition: A | Discharge status: Home / Self Care | Payer: 59 | Attending: Internal Medicine | Admitting: Internal Medicine

## 2016-08-10 ENCOUNTER — Encounter (HOSPITAL_COMMUNITY): Payer: Self-pay | Admitting: Emergency Medicine

## 2016-08-10 DIAGNOSIS — N3001 Acute cystitis with hematuria: Secondary | ICD-10-CM

## 2016-08-10 LAB — POCT URINALYSIS DIP (DEVICE)
Bilirubin Urine: NEGATIVE
GLUCOSE, UA: NEGATIVE mg/dL
Ketones, ur: NEGATIVE mg/dL
NITRITE: NEGATIVE
PH: 5.5 (ref 5.0–8.0)
Specific Gravity, Urine: >=1.03 (ref 1.005–1.030)
Urobilinogen, UA: 0.2 mg/dL (ref 0.0–1.0)

## 2016-08-10 MED ORDER — CEPHALEXIN 500 MG PO CAPS: 500.0000 mg | ORAL_CAPSULE | Freq: Two times a day (BID) | ORAL | 0 refills | Status: DC

## 2016-08-10 MED ORDER — PHENAZOPYRIDINE HCL 200 MG PO TABS: 200.0000 mg | ORAL_TABLET | Freq: Three times a day (TID) | ORAL | 0 refills | Status: DC

## 2016-08-10 NOTE — ED Triage Notes (Signed)
C/o persistent UTI sx   Seen here on 9/6 for similar sx  Sx include: dysuria, urinary freq/urgency,

## 2016-08-10 NOTE — ED Provider Notes (Signed)
CSN: [id number]     Arrival date & time 08/10/16  1422 History   First MD Initiated Contact with Patient 08/10/16 1447     Chief Complaint  Patient presents with  . Urinary Tract Infection   (Consider location/radiation/quality/duration/timing/severity/associated sxs/prior Treatment) HPI  Shelby Allen is a 70 y.o. female presenting to UC with c/o symptoms c/w prior UTI including dysuria, urinary frequency and urgency for about 1 week. She was seen for same at this UC on 9/6, started on Macrobid. She is unsure if the infection cleared completely as the discomfort did resolved but she had urinary frequency, she attributed to her DM.  Now she has bladder pressure, 5/10 in severity, and faint hematuria that started today. Denies n/v/d. Denies fever or chills. No back pain.   Past Medical History:  Diagnosis Date  . Asthma   . Diabetes mellitus   . High cholesterol   . Hypertension   . Thyroid disease    Past Surgical History:  Procedure Laterality Date  . ABDOMINAL HYSTERECTOMY    . adnoidectomy    . BILATERAL OOPHORECTOMY    . CHOLECYSTECTOMY    . FUNCTIONAL ENDOSCOPIC SINUS SURGERY    . HERNIA REPAIR    . TONSILLECTOMY     History reviewed. No pertinent family history. Social History  Substance Use Topics  . Smoking status: Never Smoker  . Smokeless tobacco: Never Used  . Alcohol use No   OB History    No data available     Review of Systems  Constitutional: Negative for chills and fever.  Gastrointestinal: Positive for abdominal pain ( bladder pressure). Negative for diarrhea, nausea and vomiting.  Genitourinary: Positive for dysuria, frequency, hematuria and urgency. Negative for decreased urine volume, flank pain, vaginal bleeding, vaginal discharge and vaginal pain.  Musculoskeletal: Negative for back pain and myalgias.    Allergies  Ciprofloxacin and Iodine  Home Medications   Prior to Admission medications   Medication Sig Start Date End Date Taking?  Authorizing Provider  ASPIRIN PO Take 81 mg by mouth every morning.    Yes Historical Provider, MD  cholecalciferol (VITAMIN D) 1000 UNITS tablet Take 1,000 Units by mouth daily.     Yes Historical Provider, MD  Ezetimibe (ZETIA PO) Take 10 mg by mouth every morning.    Yes Historical Provider, MD  gabapentin (NEURONTIN) 100 MG capsule Take 100 mg by mouth 3 (three) times daily.   Yes Historical Provider, MD  hydrochlorothiazide (MICROZIDE) 12.5 MG capsule Take 12.5 mg by mouth daily.     Yes Historical Provider, MD  levothyroxine (SYNTHROID, LEVOTHROID) 125 MCG tablet Take 125 mcg by mouth daily.     Yes Historical Provider, MD  lisinopril (PRINIVIL,ZESTRIL) 10 MG tablet Take 10 mg by mouth daily.     Yes Historical Provider, MD  metFORMIN (GLUCOPHAGE) 500 MG tablet Take 500 mg by mouth 2 (two) times daily with a meal.     Yes Historical Provider, MD  phenazopyridine (PYRIDIUM) 200 MG tablet Take 1 tablet (200 mg total) by mouth 3 (three) times daily as needed for pain. 06/20/16  Yes Melynda Ripple, MD  Rosuvastatin Calcium (CRESTOR PO) Take 10 mg by mouth every morning.    Yes Historical Provider, MD  simvastatin (ZOCOR) 20 MG tablet Take 20 mg by mouth at bedtime.     Yes Historical Provider, MD  albuterol (PROVENTIL HFA;VENTOLIN HFA) 108 (90 BASE) MCG/ACT inhaler Inhale 1-2 puffs into the lungs every 6 (six) hours as needed for wheezing  or shortness of breath. 10/02/14   Audelia Hives Presson, PA  benzonatate (TESSALON) 100 MG capsule Take 1 capsule (100 mg total) by mouth 3 (three) times daily as needed for cough. 10/02/14   Audelia Hives Presson, PA  cephALEXin (KEFLEX) 500 MG capsule Take 1 capsule (500 mg total) by mouth 2 (two) times daily. 08/10/16   Noland Fordyce, PA-C  doxycycline (VIBRAMYCIN) 100 MG capsule Take 1 capsule (100 mg total) by mouth 2 (two) times daily. 02/23/15   Melony Overly, MD  nitrofurantoin, macrocrystal-monohydrate, (MACROBID) 100 MG capsule Take 1 capsule (100 mg  total) by mouth 2 (two) times daily. X 5 days 06/20/16   Melynda Ripple, MD  phenazopyridine (PYRIDIUM) 200 MG tablet Take 1 tablet (200 mg total) by mouth 3 (three) times daily. 08/10/16   Noland Fordyce, PA-C  topiramate (TOPAMAX) 50 MG tablet Take 50 mg by mouth 2 (two) times daily as needed.     Historical Provider, MD   Meds Ordered and Administered this Visit  Medications - No data to display  BP 121/84 (BP Location: Left Arm)   Pulse 85   Temp 98.4 F (36.9 C) (Oral)   Resp 16   SpO2 95%  No data found.   Physical Exam  Constitutional: She is oriented to person, place, and time. She appears well-developed and well-nourished. No distress.  HENT:  Head: Normocephalic and atraumatic.  Right Ear: Tympanic membrane normal.  Left Ear: Tympanic membrane normal.  Mouth/Throat: Oropharynx is clear and moist.  Eyes: EOM are normal.  Neck: Normal range of motion. Neck supple.  Cardiovascular: Normal rate and regular rhythm.   Pulmonary/Chest: Effort normal. No respiratory distress. She has no wheezes. She has no rales.  Abdominal: Soft. She exhibits no distension. There is no tenderness. There is no CVA tenderness.  Musculoskeletal: Normal range of motion.  Neurological: She is alert and oriented to person, place, and time.  Skin: Skin is warm and dry. She is not diaphoretic.  Psychiatric: She has a normal mood and affect. Her behavior is normal.  Nursing note and vitals reviewed.   Urgent Care Course   Clinical Course    Procedures (including critical care time)  Labs Review Labs Reviewed  POCT URINALYSIS DIP (DEVICE) - Abnormal; Notable for the following:       Result Value   Hgb urine dipstick LARGE (*)    Protein, ur >=300 (*)    Leukocytes, UA SMALL (*)    All other components within normal limits    Imaging Review No results found.    MDM   1. Acute cystitis with hematuria    Pt c/o recurrent urinary symptoms that started this week. Tx of UTI 9/6. Urine  culture was not conclusive.  Urine culture from 09/2014 reviewed,  Keflex appears to have slight more sensitivity.    Urine culture sent Rx: Keflex  Encouraged to f/u with PCP if not improving in 4-5 days, sooner if worsening. Patient verbalized understanding and agreement with treatment plan.     Noland Fordyce, PA-C 08/10/16 [phone]

## 2016-09-24 DIAGNOSIS — Z86010 Personal history of colonic polyps: Secondary | ICD-10-CM | POA: Diagnosis not present

## 2016-09-24 DIAGNOSIS — D125 Benign neoplasm of sigmoid colon: Secondary | ICD-10-CM | POA: Diagnosis not present

## 2016-09-24 DIAGNOSIS — Z1211 Encounter for screening for malignant neoplasm of colon: Secondary | ICD-10-CM | POA: Diagnosis not present

## 2016-09-24 DIAGNOSIS — K5730 Diverticulosis of large intestine without perforation or abscess without bleeding: Secondary | ICD-10-CM | POA: Diagnosis not present

## 2016-09-24 DIAGNOSIS — K635 Polyp of colon: Secondary | ICD-10-CM | POA: Diagnosis not present

## 2016-09-26 DIAGNOSIS — Z1231 Encounter for screening mammogram for malignant neoplasm of breast: Secondary | ICD-10-CM | POA: Diagnosis not present

## 2016-09-26 DIAGNOSIS — Z803 Family history of malignant neoplasm of breast: Secondary | ICD-10-CM | POA: Diagnosis not present

## 2016-11-19 DIAGNOSIS — Z90710 Acquired absence of both cervix and uterus: Secondary | ICD-10-CM | POA: Diagnosis not present

## 2016-11-19 DIAGNOSIS — Z23 Encounter for immunization: Secondary | ICD-10-CM | POA: Diagnosis not present

## 2016-11-19 DIAGNOSIS — Z0000 Encounter for general adult medical examination without abnormal findings: Secondary | ICD-10-CM | POA: Diagnosis not present

## 2016-11-19 DIAGNOSIS — M1990 Unspecified osteoarthritis, unspecified site: Secondary | ICD-10-CM | POA: Diagnosis not present

## 2016-11-19 DIAGNOSIS — B372 Candidiasis of skin and nail: Secondary | ICD-10-CM | POA: Diagnosis not present

## 2016-11-19 DIAGNOSIS — Z6835 Body mass index (BMI) 35.0-35.9, adult: Secondary | ICD-10-CM | POA: Diagnosis not present

## 2016-11-21 MED FILL — SHINGRIX VIAL KIT: 50 | 1 days supply | Qty: 1 | Fill #0

## 2016-11-23 MED FILL — GABAPENTIN 300 MG CAPSULE: 300 | 90 days supply | Qty: 90 | Fill #0

## 2016-11-23 MED FILL — NYSTATIN 100,000 UNITS/GM O: 100000 | 10 days supply | Qty: 30 | Fill #0

## 2016-11-23 MED FILL — VENTOLIN HFA 90 MCG INHALER: 108 (90 BAS | 16 days supply | Qty: 18 | Fill #0

## 2016-12-10 MED FILL — SYNTHROID 150 MCG TABLET: 150 | 90 days supply | Qty: 100 | Fill #0

## 2016-12-10 MED FILL — HYDROCHLOROTHIAZIDE 12.5 MG: 12.5 | 90 days supply | Qty: 90 | Fill #1

## 2017-01-31 MED FILL — EZETIMIBE 10 MG TABLET: 10 | 90 days supply | Qty: 90 | Fill #0

## 2017-01-31 MED FILL — metFORMIN HCL 500 MG TABS: 500 | 22 days supply | Qty: 90 | Fill #0

## 2017-01-31 MED FILL — BENAZEPRIL HCL 20 MG TABLET: 20 | 90 days supply | Qty: 90 | Fill #0

## 2017-02-04 MED FILL — METOPROLOL SUCC ER 50 MG TA: 50 | 90 days supply | Qty: 90 | Fill #0

## 2017-02-11 DIAGNOSIS — H524 Presbyopia: Secondary | ICD-10-CM | POA: Diagnosis not present

## 2017-02-18 MED FILL — OLOPATADINE HCL 0.2 % SOLN: 0.2 | 25 days supply | Qty: 3 | Fill #0

## 2017-03-08 ENCOUNTER — Encounter (HOSPITAL_COMMUNITY): Payer: Self-pay | Admitting: *Deleted

## 2017-03-08 ENCOUNTER — Ambulatory Visit (HOSPITAL_COMMUNITY)
Admission: EM | Admit: 2017-03-08 | Discharge: 2017-03-08 | Disposition: A | Discharge status: Home / Self Care | Payer: 59 | Attending: Internal Medicine | Admitting: Internal Medicine

## 2017-03-08 DIAGNOSIS — R300 Dysuria: Secondary | ICD-10-CM | POA: Diagnosis not present

## 2017-03-08 DIAGNOSIS — N3001 Acute cystitis with hematuria: Secondary | ICD-10-CM | POA: Insufficient documentation

## 2017-03-08 DIAGNOSIS — N390 Urinary tract infection, site not specified: Secondary | ICD-10-CM | POA: Diagnosis present

## 2017-03-08 LAB — POCT URINALYSIS DIP (DEVICE)
GLUCOSE, UA: NEGATIVE mg/dL
Ketones, ur: NEGATIVE mg/dL
NITRITE: NEGATIVE
Protein, ur: >=300 mg/dL — AB
Specific Gravity, Urine: >=1.03 (ref 1.005–1.030)
Urobilinogen, UA: 0.2 mg/dL (ref 0.0–1.0)
pH: 5.5 (ref 5.0–8.0)

## 2017-03-08 MED ORDER — PHENAZOPYRIDINE HCL 200 MG PO TABS: 200.0000 mg | ORAL_TABLET | Freq: Three times a day (TID) | ORAL | 0 refills | Status: AC

## 2017-03-08 MED ORDER — NITROFURANTOIN MONOHYD MACRO 100 MG PO CAPS: 100.0000 mg | ORAL_CAPSULE | Freq: Two times a day (BID) | ORAL | 0 refills | Status: AC

## 2017-03-08 MED FILL — NITROFURANTOIN MONO-MCR 100: 100 | 10 days supply | Qty: 20 | Fill #0

## 2017-03-08 MED FILL — PHENAZOPYRIDINE 200 MG TAB: 200 | 3 days supply | Qty: 6 | Fill #0

## 2017-03-08 NOTE — ED Triage Notes (Signed)
Pt  Reports   Frequency  Urgency   And   Burning  On  Urination  X   3   Days

## 2017-03-08 NOTE — ED Provider Notes (Signed)
CSN: [id number]     Arrival date & time 03/08/17  1420 History   None    Chief Complaint  Patient presents with  . Urinary Tract Infection   (Consider location/radiation/quality/duration/timing/severity/associated sxs/prior Treatment) Patient c/o urinary frequency and urgency and dysuria.   The history is provided by the patient.  Urinary Tract Infection  Pain quality:  Aching and burning Pain severity:  Moderate Onset quality:  Sudden Duration:  3 days Timing:  Constant Progression:  Worsening Chronicity:  New Recent urinary tract infections: yes   Relieved by:  Nothing Worsened by:  Nothing Ineffective treatments:  None tried   Past Medical History:  Diagnosis Date  . Asthma   . Diabetes mellitus   . High cholesterol   . Hypertension   . Thyroid disease    Past Surgical History:  Procedure Laterality Date  . ABDOMINAL HYSTERECTOMY    . adnoidectomy    . BILATERAL OOPHORECTOMY    . CHOLECYSTECTOMY    . FUNCTIONAL ENDOSCOPIC SINUS SURGERY    . HERNIA REPAIR    . TONSILLECTOMY     No family history on file. Social History  Substance Use Topics  . Smoking status: Never Smoker  . Smokeless tobacco: Never Used  . Alcohol use No   OB History    No data available     Review of Systems  Constitutional: Negative.   HENT: Negative.   Eyes: Negative.   Respiratory: Negative.   Cardiovascular: Negative.   Gastrointestinal: Negative.   Endocrine: Negative.   Genitourinary: Positive for dysuria.  Musculoskeletal: Negative.   Allergic/Immunologic: Negative.   Neurological: Negative.   Hematological: Negative.   Psychiatric/Behavioral: Negative.     Allergies  Ciprofloxacin and Iodine  Home Medications   Prior to Admission medications   Medication Sig Start Date End Date Taking? Authorizing Provider  albuterol (PROVENTIL HFA;VENTOLIN HFA) 108 (90 BASE) MCG/ACT inhaler Inhale 1-2 puffs into the lungs every 6 (six) hours as needed for wheezing or shortness  of breath. 10/02/14   Presson, Annett Gula H, PA  ASPIRIN PO Take 81 mg by mouth every morning.     [provider]  benzonatate (TESSALON) 100 MG capsule Take 1 capsule (100 mg total) by mouth 3 (three) times daily as needed for cough. 10/02/14   Presson, Audelia Hives, PA  cephALEXin (KEFLEX) 500 MG capsule Take 1 capsule (500 mg total) by mouth 2 (two) times daily. 08/10/16   Noland Fordyce, PA-C  cholecalciferol (VITAMIN D) 1000 UNITS tablet Take 1,000 Units by mouth daily.      [provider]  doxycycline (VIBRAMYCIN) 100 MG capsule Take 1 capsule (100 mg total) by mouth 2 (two) times daily. 02/23/15   Melony Overly, MD  Ezetimibe (ZETIA PO) Take 10 mg by mouth every morning.     [provider]  gabapentin (NEURONTIN) 100 MG capsule Take 100 mg by mouth 3 (three) times daily.    [provider]  hydrochlorothiazide (MICROZIDE) 12.5 MG capsule Take 12.5 mg by mouth daily.      [provider]  levothyroxine (SYNTHROID, LEVOTHROID) 125 MCG tablet Take 125 mcg by mouth daily.      [provider]  lisinopril (PRINIVIL,ZESTRIL) 10 MG tablet Take 10 mg by mouth daily.      [provider]  metFORMIN (GLUCOPHAGE) 500 MG tablet Take 500 mg by mouth 2 (two) times daily with a meal.      [provider]  nitrofurantoin, macrocrystal-monohydrate, (MACROBID) 100  MG capsule Take 1 capsule (100 mg total) by mouth 2 (two) times daily. X 5 days 06/20/16   Melynda Ripple, MD  nitrofurantoin, macrocrystal-monohydrate, (MACROBID) 100 MG capsule Take 1 capsule (100 mg total) by mouth 2 (two) times daily. 03/08/17   Lysbeth Penner, FNP  phenazopyridine (PYRIDIUM) 200 MG tablet Take 1 tablet (200 mg total) by mouth 3 (three) times daily as needed for pain. 06/20/16   Melynda Ripple, MD  phenazopyridine (PYRIDIUM) 200 MG tablet Take 1 tablet (200 mg total) by mouth 3 (three) times daily. 08/10/16   Noland Fordyce, PA-C  phenazopyridine  (PYRIDIUM) 200 MG tablet Take 1 tablet (200 mg total) by mouth 3 (three) times daily. 03/08/17   Lysbeth Penner, FNP  Rosuvastatin Calcium (CRESTOR PO) Take 10 mg by mouth every morning.     [provider]  simvastatin (ZOCOR) 20 MG tablet Take 20 mg by mouth at bedtime.      [provider]  topiramate (TOPAMAX) 50 MG tablet Take 50 mg by mouth 2 (two) times daily as needed.     [provider]   Meds Ordered and Administered this Visit  Medications - No data to display  BP (!) 148/81 (BP Location: Right Arm)   Pulse 78   Temp 99.1 F (37.3 C) (Oral)   Resp 18   SpO2 99%  No data found.   Physical Exam  Constitutional: She appears well-developed and well-nourished.  HENT:  Head: Normocephalic and atraumatic.  Eyes: Conjunctivae and EOM are normal. Pupils are equal, round, and reactive to light.  Neck: Normal range of motion. Neck supple.  Cardiovascular: Normal rate, regular rhythm and normal heart sounds.   Pulmonary/Chest: Effort normal and breath sounds normal.  Nursing note and vitals reviewed.   Urgent Care Course     Procedures (including critical care time)  Labs Review Labs Reviewed  POCT URINALYSIS DIP (DEVICE) - Abnormal; Notable for the following:       Result Value   Bilirubin Urine SMALL (*)    Hgb urine dipstick MODERATE (*)    Protein, ur >=300 (*)    Leukocytes, UA TRACE (*)    All other components within normal limits  URINE CULTURE    Imaging Review No results found.   Visual Acuity Review  Right Eye Distance:   Left Eye Distance:   Bilateral Distance:    Right Eye Near:   Left Eye Near:    Bilateral Near:         MDM   1. Acute cystitis with hematuria   2. Dysuria    Macrobid 100mg  one po bid x 10 days  Pyridium 200mg  one po tid x 2 days #6  UA cx      Lysbeth Penner, FNP 03/08/17 1542

## 2017-03-11 ENCOUNTER — Telehealth (HOSPITAL_COMMUNITY): Payer: Self-pay | Admitting: Internal Medicine

## 2017-03-11 LAB — URINE CULTURE: Culture: >=100000 — AB

## 2017-03-11 NOTE — Telephone Encounter (Signed)
Clinical staff, please let patient know that urine culture was positive for Citrobacter germ, intermediate sensitivity to nitrofurantoin rx given at the urgent care visit 5/25.   If symptoms (dysuria, urinary frequency/urgency) are improving ok to just finish nitrofurantoin.   If symptoms are not improving/not resolved, would stop nitrofurantoin and start rx trimethoprim/sulfa.  Ok to send rx for trimethoprim/sulfa DS 1 tab bid x 5d #10 no refills.  Recheck or followup with PCP for further evaluation if symptoms are not improving.  LM

## 2017-03-18 DIAGNOSIS — K580 Irritable bowel syndrome with diarrhea: Secondary | ICD-10-CM | POA: Diagnosis not present

## 2017-03-21 DIAGNOSIS — H5371 Glare sensitivity: Secondary | ICD-10-CM | POA: Diagnosis not present

## 2017-03-21 DIAGNOSIS — H2513 Age-related nuclear cataract, bilateral: Secondary | ICD-10-CM | POA: Diagnosis not present

## 2017-03-21 DIAGNOSIS — Z01818 Encounter for other preprocedural examination: Secondary | ICD-10-CM | POA: Diagnosis not present

## 2017-03-21 DIAGNOSIS — H40013 Open angle with borderline findings, low risk, bilateral: Secondary | ICD-10-CM | POA: Diagnosis not present

## 2017-03-21 MED FILL — KETOROLAC 0.5% OPHTH SOLN: 0.5 | 30 days supply | Qty: 10 | Fill #0

## 2017-03-22 DIAGNOSIS — E559 Vitamin D deficiency, unspecified: Secondary | ICD-10-CM | POA: Diagnosis not present

## 2017-03-22 DIAGNOSIS — E782 Mixed hyperlipidemia: Secondary | ICD-10-CM | POA: Diagnosis not present

## 2017-03-22 DIAGNOSIS — E119 Type 2 diabetes mellitus without complications: Secondary | ICD-10-CM | POA: Diagnosis not present

## 2017-03-22 DIAGNOSIS — E039 Hypothyroidism, unspecified: Secondary | ICD-10-CM | POA: Diagnosis not present

## 2017-03-26 DIAGNOSIS — E039 Hypothyroidism, unspecified: Secondary | ICD-10-CM | POA: Diagnosis not present

## 2017-03-26 DIAGNOSIS — E559 Vitamin D deficiency, unspecified: Secondary | ICD-10-CM | POA: Diagnosis not present

## 2017-03-26 DIAGNOSIS — Z7984 Long term (current) use of oral hypoglycemic drugs: Secondary | ICD-10-CM | POA: Diagnosis not present

## 2017-03-26 DIAGNOSIS — E782 Mixed hyperlipidemia: Secondary | ICD-10-CM | POA: Diagnosis not present

## 2017-03-26 DIAGNOSIS — I10 Essential (primary) hypertension: Secondary | ICD-10-CM | POA: Diagnosis not present

## 2017-03-26 DIAGNOSIS — Z9989 Dependence on other enabling machines and devices: Secondary | ICD-10-CM | POA: Diagnosis not present

## 2017-03-26 DIAGNOSIS — E119 Type 2 diabetes mellitus without complications: Secondary | ICD-10-CM | POA: Diagnosis not present

## 2017-03-26 DIAGNOSIS — G4733 Obstructive sleep apnea (adult) (pediatric): Secondary | ICD-10-CM | POA: Diagnosis not present

## 2017-03-26 DIAGNOSIS — Z833 Family history of diabetes mellitus: Secondary | ICD-10-CM | POA: Diagnosis not present

## 2017-03-27 MED FILL — metFORMIN HCL 500 MG TABS: 500 | 90 days supply | Qty: 360 | Fill #0

## 2017-03-27 MED FILL — SYNTHROID 150 MCG TABLET: 150 | 90 days supply | Qty: 90 | Fill #0

## 2017-03-27 MED FILL — HYDROCHLOROTHIAZIDE 12.5 MG: 12.5 | 90 days supply | Qty: 90 | Fill #0

## 2017-03-27 MED FILL — ROSUVASTATIN CALCIUM 10 MG: 10 | 90 days supply | Qty: 90 | Fill #0

## 2017-05-08 DIAGNOSIS — D2339 Other benign neoplasm of skin of other parts of face: Secondary | ICD-10-CM | POA: Diagnosis not present

## 2017-05-08 DIAGNOSIS — L821 Other seborrheic keratosis: Secondary | ICD-10-CM | POA: Diagnosis not present

## 2017-05-08 DIAGNOSIS — D2262 Melanocytic nevi of left upper limb, including shoulder: Secondary | ICD-10-CM | POA: Diagnosis not present

## 2017-06-25 MED FILL — XIFAXAN 550 MG TABLET: 550 | 14 days supply | Qty: 42 | Fill #0

## 2017-07-19 MED FILL — KETOROLAC 0.5% OPHTH SOLN: 0.5 | 24 days supply | Qty: 5 | Fill #0

## 2017-07-30 DIAGNOSIS — E559 Vitamin D deficiency, unspecified: Secondary | ICD-10-CM | POA: Diagnosis not present

## 2017-07-30 DIAGNOSIS — E119 Type 2 diabetes mellitus without complications: Secondary | ICD-10-CM | POA: Diagnosis not present

## 2017-07-30 DIAGNOSIS — E039 Hypothyroidism, unspecified: Secondary | ICD-10-CM | POA: Diagnosis not present

## 2017-07-30 DIAGNOSIS — E782 Mixed hyperlipidemia: Secondary | ICD-10-CM | POA: Diagnosis not present

## 2017-08-02 DIAGNOSIS — E782 Mixed hyperlipidemia: Secondary | ICD-10-CM | POA: Diagnosis not present

## 2017-08-02 DIAGNOSIS — E1122 Type 2 diabetes mellitus with diabetic chronic kidney disease: Secondary | ICD-10-CM | POA: Diagnosis not present

## 2017-08-02 DIAGNOSIS — I129 Hypertensive chronic kidney disease with stage 1 through stage 4 chronic kidney disease, or unspecified chronic kidney disease: Secondary | ICD-10-CM | POA: Diagnosis not present

## 2017-08-02 DIAGNOSIS — E039 Hypothyroidism, unspecified: Secondary | ICD-10-CM | POA: Diagnosis not present

## 2017-08-02 DIAGNOSIS — E559 Vitamin D deficiency, unspecified: Secondary | ICD-10-CM | POA: Diagnosis not present

## 2017-08-02 DIAGNOSIS — Z7984 Long term (current) use of oral hypoglycemic drugs: Secondary | ICD-10-CM | POA: Diagnosis not present

## 2017-08-02 DIAGNOSIS — N183 Chronic kidney disease, stage 3 (moderate): Secondary | ICD-10-CM | POA: Diagnosis not present

## 2017-09-11 DIAGNOSIS — H2512 Age-related nuclear cataract, left eye: Secondary | ICD-10-CM | POA: Diagnosis not present

## 2017-09-13 MED FILL — BENAZEPRIL HCL 20 MG TABLET: 20 | 90 days supply | Qty: 90 | Fill #0

## 2017-09-13 MED FILL — METOPROLOL SUCC ER 50 MG TA: 50 | 90 days supply | Qty: 90 | Fill #0

## 2017-09-13 MED FILL — HYDROCHLOROTHIAZIDE 12.5 MG: 12.5 | 90 days supply | Qty: 90 | Fill #1

## 2017-09-13 MED FILL — EZETIMIBE 10 MG TABS: 10 | 90 days supply | Qty: 90 | Fill #0

## 2017-09-13 MED FILL — GABAPENTIN 300 MG CAPS: 300 | 90 days supply | Qty: 90 | Fill #1

## 2017-09-13 MED FILL — SYNTHROID 150 MCG TABLET: 150 | 90 days supply | Qty: 90 | Fill #1

## 2017-09-18 DIAGNOSIS — G4733 Obstructive sleep apnea (adult) (pediatric): Secondary | ICD-10-CM | POA: Diagnosis not present

## 2017-09-18 DIAGNOSIS — H5371 Glare sensitivity: Secondary | ICD-10-CM | POA: Diagnosis not present

## 2017-09-18 DIAGNOSIS — H2512 Age-related nuclear cataract, left eye: Secondary | ICD-10-CM | POA: Diagnosis not present

## 2017-09-18 DIAGNOSIS — Z7982 Long term (current) use of aspirin: Secondary | ICD-10-CM | POA: Diagnosis not present

## 2017-09-18 DIAGNOSIS — J45909 Unspecified asthma, uncomplicated: Secondary | ICD-10-CM | POA: Diagnosis not present

## 2017-09-18 DIAGNOSIS — Z881 Allergy status to other antibiotic agents status: Secondary | ICD-10-CM | POA: Diagnosis not present

## 2017-09-18 DIAGNOSIS — Z8679 Personal history of other diseases of the circulatory system: Secondary | ICD-10-CM | POA: Diagnosis not present

## 2017-09-18 DIAGNOSIS — E079 Disorder of thyroid, unspecified: Secondary | ICD-10-CM | POA: Diagnosis not present

## 2017-09-18 DIAGNOSIS — I10 Essential (primary) hypertension: Secondary | ICD-10-CM | POA: Diagnosis not present

## 2017-09-18 DIAGNOSIS — E1136 Type 2 diabetes mellitus with diabetic cataract: Secondary | ICD-10-CM | POA: Diagnosis not present

## 2017-10-02 DIAGNOSIS — R922 Inconclusive mammogram: Secondary | ICD-10-CM | POA: Diagnosis not present

## 2017-10-02 DIAGNOSIS — Z803 Family history of malignant neoplasm of breast: Secondary | ICD-10-CM | POA: Diagnosis not present

## 2017-10-02 DIAGNOSIS — Z1231 Encounter for screening mammogram for malignant neoplasm of breast: Secondary | ICD-10-CM | POA: Diagnosis not present

## 2017-10-31 MED FILL — XIFAXAN 550 MG TABLET: 550 | 14 days supply | Qty: 42 | Fill #0

## 2017-12-07 ENCOUNTER — Other Ambulatory Visit: Payer: Self-pay

## 2017-12-07 ENCOUNTER — Ambulatory Visit (HOSPITAL_COMMUNITY)
Admission: EM | Admit: 2017-12-07 | Discharge: 2017-12-07 | Disposition: A | Discharge status: Home / Self Care | Payer: 59 | Attending: Emergency Medicine | Admitting: Emergency Medicine

## 2017-12-07 ENCOUNTER — Encounter (HOSPITAL_COMMUNITY): Payer: Self-pay | Admitting: *Deleted

## 2017-12-07 DIAGNOSIS — J4520 Mild intermittent asthma, uncomplicated: Secondary | ICD-10-CM

## 2017-12-07 DIAGNOSIS — R05 Cough: Secondary | ICD-10-CM

## 2017-12-07 DIAGNOSIS — R059 Cough, unspecified: Secondary | ICD-10-CM

## 2017-12-07 MED ORDER — OSELTAMIVIR PHOSPHATE 75 MG PO CAPS: 75.0000 mg | ORAL_CAPSULE | Freq: Every day | ORAL | 0 refills | Status: AC

## 2017-12-07 MED ORDER — ONDANSETRON HCL 4 MG PO TABS: 4.0000 mg | ORAL_TABLET | Freq: Four times a day (QID) | ORAL | 0 refills | Status: AC

## 2017-12-07 MED ORDER — BENZONATATE 200 MG PO CAPS: 200.0000 mg | ORAL_CAPSULE | Freq: Three times a day (TID) | ORAL | 0 refills | Status: AC | PRN

## 2017-12-07 MED ORDER — ALBUTEROL SULFATE HFA 108 (90 BASE) MCG/ACT IN AERS: 1.0000 | INHALATION_SPRAY | Freq: Four times a day (QID) | RESPIRATORY_TRACT | 0 refills | Status: AC | PRN

## 2017-12-07 NOTE — ED Provider Notes (Signed)
[place]    CSN: [id number] Arrival date & time: 12/07/17  1158     History   Chief Complaint Chief Complaint  Patient presents with  . Cough    HPI Larayne Baxley is a 72 y.o. female history of diabetes, hypertension, asthma, presenting today with cough.  Cough is been going on for approximately 2 days.  She initially thought it was due to asthma.  She is concerned because her 64-year-old grandson has had similar symptoms, then woke up yesterday with fever and tested positive for flu and strep.  She is requesting prophylactic treatment for flu.  Also requesting albuterol inhaler.  She has not had any fevers, denies nausea, vomiting, diarrhea, abdominal pain.  Denies shortness of breath or chest pain.  Had minimal congestion.  Denies sore throat.  HPI  Past Medical History:  Diagnosis Date  . Asthma   . Diabetes mellitus   . High cholesterol   . Hypertension   . Thyroid disease     There are no active problems to display for this patient.   Past Surgical History:  Procedure Laterality Date  . ABDOMINAL HYSTERECTOMY    . adnoidectomy    . BILATERAL OOPHORECTOMY    . CHOLECYSTECTOMY    . FUNCTIONAL ENDOSCOPIC SINUS SURGERY    . HERNIA REPAIR    . TONSILLECTOMY      OB History    No data available       Home Medications    Prior to Admission medications   Medication Sig Start Date End Date Taking? Authorizing Provider  ASPIRIN PO Take 81 mg by mouth every morning.    Yes [provider]  cholecalciferol (VITAMIN D) 1000 UNITS tablet Take 1,000 Units by mouth daily.     Yes [provider]  Ezetimibe (ZETIA PO) Take 10 mg by mouth every morning.    Yes [provider]  gabapentin (NEURONTIN) 100 MG capsule Take 100 mg by mouth 3 (three) times daily.   Yes [provider]  hydrochlorothiazide (MICROZIDE) 12.5 MG capsule Take 12.5 mg by mouth daily.     Yes [provider]  levothyroxine (SYNTHROID,  LEVOTHROID) 125 MCG tablet Take 125 mcg by mouth daily.     Yes [provider]  lisinopril (PRINIVIL,ZESTRIL) 10 MG tablet Take 10 mg by mouth daily.     Yes [provider]  metFORMIN (GLUCOPHAGE) 500 MG tablet Take 500 mg by mouth 2 (two) times daily with a meal.     Yes [provider]  Rosuvastatin Calcium (CRESTOR PO) Take 10 mg by mouth every morning.    Yes [provider]  simvastatin (ZOCOR) 20 MG tablet Take 20 mg by mouth at bedtime.     Yes [provider]  albuterol (PROVENTIL HFA;VENTOLIN HFA) 108 (90 Base) MCG/ACT inhaler Inhale 1-2 puffs into the lungs every 6 (six) hours as needed for wheezing or shortness of breath. 12/07/17   Georga Stys C, PA-C  benzonatate (TESSALON) 200 MG capsule Take 1 capsule (200 mg total) by mouth 3 (three) times daily as needed for up to 7 days for cough. 12/07/17 12/14/17  Rosco Harriott C, PA-C  nitrofurantoin, macrocrystal-monohydrate, (MACROBID) 100 MG capsule Take 1 capsule (100 mg total) by mouth 2 (two) times daily. 03/08/17   Lysbeth Penner, FNP  ondansetron (ZOFRAN) 4 MG tablet Take 1 tablet (4 mg total) by mouth every 6 (six) hours. 12/07/17   Chace Klippel C, PA-C  oseltamivir (TAMIFLU) 75  MG capsule Take 1 capsule (75 mg total) by mouth daily for 7 days. 12/07/17 12/14/17  Yovana Scogin C, PA-C  phenazopyridine (PYRIDIUM) 200 MG tablet Take 1 tablet (200 mg total) by mouth 3 (three) times daily. 03/08/17   Lysbeth Penner, FNP  topiramate (TOPAMAX) 50 MG tablet Take 50 mg by mouth 2 (two) times daily as needed.     [provider]    Family History Family History  Problem Relation Age of Onset  . Stroke Mother   . CAD Mother   . Cancer Mother   . CAD Father   . Cancer Sister     Social History Social History   Tobacco Use  . Smoking status: Never Smoker  . Smokeless tobacco: Never Used  Substance Use Topics  . Alcohol use: No  . Drug use: No     Allergies     Ciprofloxacin and Iodine   Review of Systems Review of Systems  Constitutional: Negative for chills, fatigue and fever.  HENT: Positive for congestion and rhinorrhea. Negative for ear pain, sinus pressure, sore throat and trouble swallowing.   Respiratory: Positive for cough. Negative for chest tightness and shortness of breath.   Cardiovascular: Negative for chest pain.  Gastrointestinal: Negative for abdominal pain, nausea and vomiting.  Musculoskeletal: Negative for myalgias.  Skin: Negative for rash.  Neurological: Negative for dizziness, light-headedness and headaches.     Physical Exam Triage Vital Signs ED Triage Vitals [12/07/17 1207]  Enc Vitals Group     BP (!) 194/71     Pulse Rate 75     Resp 18     Temp 98.4 F (36.9 C)     Temp Source Oral     SpO2 97 %     Weight      Height      Head Circumference      Peak Flow      Pain Score      Pain Loc      Pain Edu?      Excl. in [place]?    No data found.  Updated Vital Signs BP (!) 194/71 Comment: Has not taken HTN meds today  Pulse 75   Temp 98.4 F (36.9 C) (Oral)   Resp 18   SpO2 97%   Visual Acuity Right Eye Distance:   Left Eye Distance:   Bilateral Distance:    Right Eye Near:   Left Eye Near:    Bilateral Near:     Physical Exam  Constitutional: She appears well-developed and well-nourished. No distress.  HENT:  Head: Normocephalic and atraumatic.  Bilateral TMs without erythema, nasal mucosa minimally erythematous, rhinorrhea present in left nares.  Posterior oropharynx erythematous, tonsils are absent, no exudate.  Eyes: Conjunctivae are normal.  Neck: Neck supple.  Cardiovascular: Normal rate and regular rhythm.  No murmur heard. Pulmonary/Chest: Effort normal and breath sounds normal. No respiratory distress.  Breathing comfortably at rest, CTA BL, no adventitious sounds appreciated  Abdominal: Soft. There is no tenderness.  Musculoskeletal: She exhibits no edema.  Neurological: She  is alert.  Skin: Skin is warm and dry.  Psychiatric: She has a normal mood and affect.  Nursing note and vitals reviewed.    UC Treatments / Results  Labs (all labs ordered are listed, but only abnormal results are displayed) Labs Reviewed - No data to display  EKG  EKG Interpretation None       Radiology No results found.  Procedures Procedures (including critical  care time)  Medications Ordered in UC Medications - No data to display   Initial Impression / Assessment and Plan / UC Course  I have reviewed the triage vital signs and the nursing notes.  Pertinent labs & imaging results that were available during my care of the patient were reviewed by me and considered in my medical decision making (see chart for details).     Will refill albuterol inhaler.  Provide Tessalon she has had success with this in the past for cough relief.  CBC/up-to-date resource check for flu prophylaxis, patient is over 65 will provide 1 week Tamiflu daily. Discussed strict return precautions. Patient verbalized understanding and is agreeable with plan.   Final Clinical Impressions(s) / UC Diagnoses   Final diagnoses:  Cough  Mild intermittent asthma without complication    ED Discharge Orders        Ordered    oseltamivir (TAMIFLU) 75 MG capsule  Daily     12/07/17 1230    benzonatate (TESSALON) 200 MG capsule  3 times daily PRN     12/07/17 1230    albuterol (PROVENTIL HFA;VENTOLIN HFA) 108 (90 Base) MCG/ACT inhaler  Every 6 hours PRN     12/07/17 1230    ondansetron (ZOFRAN) 4 MG tablet  Every 6 hours     12/07/17 1230       Controlled Substance Prescriptions [place] Controlled Substance Registry consulted? Not Applicable   Janith Lima, [place] 12/07/17 1237

## 2017-12-07 NOTE — Discharge Instructions (Signed)
I have refilled your inhaler, please use Tessalon for your cough.  Please take the Tamiflu as prescribed.  This may cause headache, nausea, vomiting.  Have sent in Zofran to use as needed for nausea and vomiting.  Please return if you have worsening symptoms, develop difficulty breathing, fever.

## 2017-12-07 NOTE — ED Triage Notes (Addendum)
Reports cough and slight wheezing x 2 days without fever.  Has been using inhaler; unable to sleep due to cough.  Family member with same sxs has since been diagnosed with influenza & strep.  Also requesting refill for albuterol HFA.

## 2018-01-29 DIAGNOSIS — E782 Mixed hyperlipidemia: Secondary | ICD-10-CM | POA: Diagnosis not present

## 2018-01-29 DIAGNOSIS — E039 Hypothyroidism, unspecified: Secondary | ICD-10-CM | POA: Diagnosis not present

## 2018-01-29 DIAGNOSIS — E119 Type 2 diabetes mellitus without complications: Secondary | ICD-10-CM | POA: Diagnosis not present

## 2018-01-29 DIAGNOSIS — E559 Vitamin D deficiency, unspecified: Secondary | ICD-10-CM | POA: Diagnosis not present

## 2018-01-30 DIAGNOSIS — Z961 Presence of intraocular lens: Secondary | ICD-10-CM | POA: Diagnosis not present

## 2018-01-30 DIAGNOSIS — H2511 Age-related nuclear cataract, right eye: Secondary | ICD-10-CM | POA: Diagnosis not present

## 2018-01-30 DIAGNOSIS — H1045 Other chronic allergic conjunctivitis: Secondary | ICD-10-CM | POA: Diagnosis not present

## 2018-02-03 DIAGNOSIS — E119 Type 2 diabetes mellitus without complications: Secondary | ICD-10-CM | POA: Diagnosis not present

## 2018-02-03 DIAGNOSIS — Z7984 Long term (current) use of oral hypoglycemic drugs: Secondary | ICD-10-CM | POA: Diagnosis not present

## 2018-02-03 DIAGNOSIS — E669 Obesity, unspecified: Secondary | ICD-10-CM | POA: Diagnosis not present

## 2018-02-03 DIAGNOSIS — E782 Mixed hyperlipidemia: Secondary | ICD-10-CM | POA: Diagnosis not present

## 2018-02-03 DIAGNOSIS — N183 Chronic kidney disease, stage 3 (moderate): Secondary | ICD-10-CM | POA: Diagnosis not present

## 2018-02-03 DIAGNOSIS — E559 Vitamin D deficiency, unspecified: Secondary | ICD-10-CM | POA: Diagnosis not present

## 2018-02-03 DIAGNOSIS — I129 Hypertensive chronic kidney disease with stage 1 through stage 4 chronic kidney disease, or unspecified chronic kidney disease: Secondary | ICD-10-CM | POA: Diagnosis not present

## 2018-02-03 DIAGNOSIS — E890 Postprocedural hypothyroidism: Secondary | ICD-10-CM | POA: Diagnosis not present

## 2018-02-03 DIAGNOSIS — E039 Hypothyroidism, unspecified: Secondary | ICD-10-CM | POA: Diagnosis not present

## 2018-02-14 DIAGNOSIS — H2511 Age-related nuclear cataract, right eye: Secondary | ICD-10-CM | POA: Diagnosis not present

## 2018-02-14 DIAGNOSIS — H52221 Regular astigmatism, right eye: Secondary | ICD-10-CM | POA: Diagnosis not present

## 2018-02-14 DIAGNOSIS — H5371 Glare sensitivity: Secondary | ICD-10-CM | POA: Diagnosis not present

## 2018-02-14 DIAGNOSIS — Z961 Presence of intraocular lens: Secondary | ICD-10-CM | POA: Diagnosis not present

## 2018-03-12 MED FILL — XIFAXAN 550 MG TABLET: 550 | 14 days supply | Qty: 42 | Fill #0

## 2018-03-12 MED FILL — METOPROLOL SUCCINATE ER 50: 50 | 90 days supply | Qty: 90 | Fill #1

## 2018-03-12 MED FILL — BENAZEPRIL HCL 20 MG TABLET: 20 | 90 days supply | Qty: 90 | Fill #1

## 2018-03-12 MED FILL — EZETIMIBE 10 MG TABS: 10 | 90 days supply | Qty: 90 | Fill #1

## 2018-03-12 MED FILL — HYDROCHLOROTHIAZIDE 12.5 MG: 12.5 | 90 days supply | Qty: 90 | Fill #2

## 2018-03-12 MED FILL — metFORMIN HCL 500 MG TABS: 500 | 90 days supply | Qty: 360 | Fill #0

## 2018-03-12 MED FILL — SYNTHROID 150 MCG TABLET: 150 | 90 days supply | Qty: 90 | Fill #2

## 2018-06-09 MED FILL — HYDROCHLOROTHIAZIDE 12.5 MG: 12.5 | 90 days supply | Qty: 90 | Fill #0

## 2018-06-09 MED FILL — SYNTHROID 150 MCG TABLET: 150 | 90 days supply | Qty: 90 | Fill #0

## 2018-06-09 MED FILL — BENAZEPRIL HCL 20 MG TABLET: 20 | 90 days supply | Qty: 90 | Fill #0

## 2018-06-09 MED FILL — METOPROLOL SUCCINATE ER 50: 50 | 90 days supply | Qty: 90 | Fill #0

## 2018-06-09 MED FILL — EZETIMIBE 10 MG TABLET: 10 | 90 days supply | Qty: 90 | Fill #0

## 2018-06-11 DIAGNOSIS — H1032 Unspecified acute conjunctivitis, left eye: Secondary | ICD-10-CM | POA: Diagnosis not present

## 2018-06-11 DIAGNOSIS — H2511 Age-related nuclear cataract, right eye: Secondary | ICD-10-CM | POA: Diagnosis not present

## 2018-07-10 DIAGNOSIS — H524 Presbyopia: Secondary | ICD-10-CM | POA: Diagnosis not present

## 2018-07-30 DIAGNOSIS — E559 Vitamin D deficiency, unspecified: Secondary | ICD-10-CM | POA: Diagnosis not present

## 2018-07-30 DIAGNOSIS — E119 Type 2 diabetes mellitus without complications: Secondary | ICD-10-CM | POA: Diagnosis not present

## 2018-07-30 DIAGNOSIS — E782 Mixed hyperlipidemia: Secondary | ICD-10-CM | POA: Diagnosis not present

## 2018-07-30 DIAGNOSIS — E039 Hypothyroidism, unspecified: Secondary | ICD-10-CM | POA: Diagnosis not present

## 2018-08-06 DIAGNOSIS — B353 Tinea pedis: Secondary | ICD-10-CM | POA: Diagnosis not present

## 2018-08-06 DIAGNOSIS — K580 Irritable bowel syndrome with diarrhea: Secondary | ICD-10-CM | POA: Diagnosis not present

## 2018-08-06 DIAGNOSIS — L570 Actinic keratosis: Secondary | ICD-10-CM | POA: Diagnosis not present

## 2018-08-06 DIAGNOSIS — L821 Other seborrheic keratosis: Secondary | ICD-10-CM | POA: Diagnosis not present

## 2018-08-06 DIAGNOSIS — Z23 Encounter for immunization: Secondary | ICD-10-CM | POA: Diagnosis not present

## 2018-08-07 DIAGNOSIS — N183 Chronic kidney disease, stage 3 (moderate): Secondary | ICD-10-CM | POA: Diagnosis not present

## 2018-08-07 DIAGNOSIS — I129 Hypertensive chronic kidney disease with stage 1 through stage 4 chronic kidney disease, or unspecified chronic kidney disease: Secondary | ICD-10-CM | POA: Diagnosis not present

## 2018-08-07 DIAGNOSIS — E119 Type 2 diabetes mellitus without complications: Secondary | ICD-10-CM | POA: Diagnosis not present

## 2018-08-07 DIAGNOSIS — Z794 Long term (current) use of insulin: Secondary | ICD-10-CM | POA: Diagnosis not present

## 2018-08-07 DIAGNOSIS — E039 Hypothyroidism, unspecified: Secondary | ICD-10-CM | POA: Diagnosis not present

## 2018-08-07 DIAGNOSIS — E782 Mixed hyperlipidemia: Secondary | ICD-10-CM | POA: Diagnosis not present

## 2018-08-07 DIAGNOSIS — E559 Vitamin D deficiency, unspecified: Secondary | ICD-10-CM | POA: Diagnosis not present

## 2018-08-12 MED FILL — TRULICITY 0.75 MG/0.5 ML PE: 0.75 | 84 days supply | Qty: 6 | Fill #0

## 2018-08-18 MED FILL — XIFAXAN 550 MG TABLET: 550 | 14 days supply | Qty: 42 | Fill #0

## 2018-08-20 MED FILL — metFORMIN HCL ER 500 MG TB2: 500 | 90 days supply | Qty: 180 | Fill #0

## 2018-08-20 MED FILL — KETOCONAZOLE 2% CREAM: 2 | 30 days supply | Qty: 30 | Fill #0

## 2018-08-20 MED FILL — OSCIMIN SL 0.125 MG TABLET: 0.125 | 5 days supply | Qty: 30 | Fill #0

## 2018-09-29 MED FILL — HYOSCYAMINE ER 0.375 MG TAB: 0.375 | 30 days supply | Qty: 30 | Fill #0

## 2018-10-29 MED FILL — METOPROLOL SUCCINATE ER 50: 50 | 90 days supply | Qty: 90 | Fill #1

## 2018-10-29 MED FILL — HYDROCHLOROTHIAZIDE 12.5 MG: 12.5 | 90 days supply | Qty: 90 | Fill #1

## 2018-10-29 MED FILL — OSCIMIN SR 0.375 MG TABLET: 0.375 | 30 days supply | Qty: 30 | Fill #1

## 2018-10-30 DIAGNOSIS — E559 Vitamin D deficiency, unspecified: Secondary | ICD-10-CM | POA: Diagnosis not present

## 2018-10-30 DIAGNOSIS — E039 Hypothyroidism, unspecified: Secondary | ICD-10-CM | POA: Diagnosis not present

## 2018-10-30 DIAGNOSIS — E119 Type 2 diabetes mellitus without complications: Secondary | ICD-10-CM | POA: Diagnosis not present

## 2018-10-30 DIAGNOSIS — E782 Mixed hyperlipidemia: Secondary | ICD-10-CM | POA: Diagnosis not present

## 2018-10-30 MED FILL — SYNTHROID 150 MCG TABLET: 150 | 90 days supply | Qty: 90 | Fill #1

## 2018-11-07 DIAGNOSIS — Z794 Long term (current) use of insulin: Secondary | ICD-10-CM | POA: Diagnosis not present

## 2018-11-07 DIAGNOSIS — E782 Mixed hyperlipidemia: Secondary | ICD-10-CM | POA: Diagnosis not present

## 2018-11-07 DIAGNOSIS — I129 Hypertensive chronic kidney disease with stage 1 through stage 4 chronic kidney disease, or unspecified chronic kidney disease: Secondary | ICD-10-CM | POA: Diagnosis not present

## 2018-11-07 DIAGNOSIS — E119 Type 2 diabetes mellitus without complications: Secondary | ICD-10-CM | POA: Diagnosis not present

## 2018-11-07 DIAGNOSIS — E559 Vitamin D deficiency, unspecified: Secondary | ICD-10-CM | POA: Diagnosis not present

## 2018-11-07 DIAGNOSIS — E039 Hypothyroidism, unspecified: Secondary | ICD-10-CM | POA: Diagnosis not present

## 2018-11-07 DIAGNOSIS — N183 Chronic kidney disease, stage 3 (moderate): Secondary | ICD-10-CM | POA: Diagnosis not present

## 2018-11-07 MED FILL — ROSUVASTATIN CALCIUM 10 MG: 10 | 90 days supply | Qty: 90 | Fill #0

## 2018-11-12 MED FILL — TRULICITY 0.75 MG/0.5 ML PE: 0.75 | 84 days supply | Qty: 6 | Fill #0

## 2019-03-16 DIAGNOSIS — H524 Presbyopia: Secondary | ICD-10-CM | POA: Diagnosis not present

## 2019-04-03 DIAGNOSIS — E119 Type 2 diabetes mellitus without complications: Secondary | ICD-10-CM | POA: Diagnosis not present

## 2019-04-03 DIAGNOSIS — E039 Hypothyroidism, unspecified: Secondary | ICD-10-CM | POA: Diagnosis not present

## 2019-04-03 DIAGNOSIS — E782 Mixed hyperlipidemia: Secondary | ICD-10-CM | POA: Diagnosis not present

## 2019-04-03 DIAGNOSIS — E559 Vitamin D deficiency, unspecified: Secondary | ICD-10-CM | POA: Diagnosis not present

## 2019-04-10 DIAGNOSIS — E890 Postprocedural hypothyroidism: Secondary | ICD-10-CM | POA: Diagnosis not present

## 2019-04-10 DIAGNOSIS — I129 Hypertensive chronic kidney disease with stage 1 through stage 4 chronic kidney disease, or unspecified chronic kidney disease: Secondary | ICD-10-CM | POA: Diagnosis not present

## 2019-04-10 DIAGNOSIS — N183 Chronic kidney disease, stage 3 (moderate): Secondary | ICD-10-CM | POA: Diagnosis not present

## 2019-04-10 DIAGNOSIS — E559 Vitamin D deficiency, unspecified: Secondary | ICD-10-CM | POA: Diagnosis not present

## 2019-04-10 DIAGNOSIS — E1122 Type 2 diabetes mellitus with diabetic chronic kidney disease: Secondary | ICD-10-CM | POA: Diagnosis not present

## 2019-04-10 DIAGNOSIS — E782 Mixed hyperlipidemia: Secondary | ICD-10-CM | POA: Diagnosis not present

## 2019-04-10 MED FILL — SYNTHROID 150 MCG TABLET: 150 | 90 days supply | Qty: 90 | Fill #0

## 2019-04-10 MED FILL — TRULICITY 1.5 MG/0.5 ML PEN: 1.5 | 84 days supply | Qty: 6 | Fill #0

## 2019-04-15 DIAGNOSIS — G8929 Other chronic pain: Secondary | ICD-10-CM | POA: Diagnosis not present

## 2019-04-15 DIAGNOSIS — R0600 Dyspnea, unspecified: Secondary | ICD-10-CM | POA: Diagnosis not present

## 2019-04-15 DIAGNOSIS — Z0000 Encounter for general adult medical examination without abnormal findings: Secondary | ICD-10-CM | POA: Diagnosis not present

## 2019-04-15 DIAGNOSIS — Z23 Encounter for immunization: Secondary | ICD-10-CM | POA: Diagnosis not present

## 2019-04-15 DIAGNOSIS — Z780 Asymptomatic menopausal state: Secondary | ICD-10-CM | POA: Diagnosis not present

## 2019-04-15 DIAGNOSIS — M5416 Radiculopathy, lumbar region: Secondary | ICD-10-CM | POA: Diagnosis not present

## 2019-04-15 DIAGNOSIS — J4520 Mild intermittent asthma, uncomplicated: Secondary | ICD-10-CM | POA: Diagnosis not present

## 2019-04-15 DIAGNOSIS — Z1239 Encounter for other screening for malignant neoplasm of breast: Secondary | ICD-10-CM | POA: Diagnosis not present

## 2019-04-15 DIAGNOSIS — Z1231 Encounter for screening mammogram for malignant neoplasm of breast: Secondary | ICD-10-CM | POA: Diagnosis not present

## 2019-04-15 DIAGNOSIS — E1122 Type 2 diabetes mellitus with diabetic chronic kidney disease: Secondary | ICD-10-CM | POA: Diagnosis not present

## 2019-04-15 MED FILL — ALBUTEROL SULFATE HFA 108 (: 108 (90 BAS | 16 days supply | Qty: 18 | Fill #0

## 2019-04-15 MED FILL — OSCIMIN SR 0.375 MG TABLET: 0.375 | 30 days supply | Qty: 30 | Fill #2

## 2019-04-15 MED FILL — GABAPENTIN 300 MG CAPSULE: 300 | 30 days supply | Qty: 30 | Fill #0

## 2019-04-30 DIAGNOSIS — R0600 Dyspnea, unspecified: Secondary | ICD-10-CM | POA: Diagnosis not present

## 2019-05-26 DIAGNOSIS — R9439 Abnormal result of other cardiovascular function study: Secondary | ICD-10-CM | POA: Diagnosis not present

## 2019-05-26 DIAGNOSIS — R0600 Dyspnea, unspecified: Secondary | ICD-10-CM | POA: Diagnosis not present

## 2019-05-26 DIAGNOSIS — E782 Mixed hyperlipidemia: Secondary | ICD-10-CM | POA: Diagnosis not present

## 2019-05-26 MED FILL — predniSONE 20 MG TABS: 20 | 2 days supply | Qty: 6 | Fill #0

## 2019-06-02 DIAGNOSIS — R0600 Dyspnea, unspecified: Secondary | ICD-10-CM | POA: Diagnosis not present

## 2019-06-02 DIAGNOSIS — I110 Hypertensive heart disease with heart failure: Secondary | ICD-10-CM | POA: Diagnosis not present

## 2019-06-02 DIAGNOSIS — R0602 Shortness of breath: Secondary | ICD-10-CM | POA: Diagnosis not present

## 2019-06-02 DIAGNOSIS — I5032 Chronic diastolic (congestive) heart failure: Secondary | ICD-10-CM | POA: Diagnosis not present

## 2019-06-02 DIAGNOSIS — G4733 Obstructive sleep apnea (adult) (pediatric): Secondary | ICD-10-CM | POA: Diagnosis not present

## 2019-06-02 DIAGNOSIS — R9439 Abnormal result of other cardiovascular function study: Secondary | ICD-10-CM | POA: Diagnosis not present

## 2019-06-09 DIAGNOSIS — Z780 Asymptomatic menopausal state: Secondary | ICD-10-CM | POA: Diagnosis not present

## 2019-06-09 DIAGNOSIS — M8580 Other specified disorders of bone density and structure, unspecified site: Secondary | ICD-10-CM | POA: Diagnosis not present

## 2019-06-23 DIAGNOSIS — I10 Essential (primary) hypertension: Secondary | ICD-10-CM | POA: Diagnosis not present

## 2019-06-23 DIAGNOSIS — R0600 Dyspnea, unspecified: Secondary | ICD-10-CM | POA: Diagnosis not present

## 2019-06-23 DIAGNOSIS — R9439 Abnormal result of other cardiovascular function study: Secondary | ICD-10-CM | POA: Diagnosis not present

## 2019-06-23 DIAGNOSIS — E785 Hyperlipidemia, unspecified: Secondary | ICD-10-CM | POA: Diagnosis not present

## 2019-06-23 MED FILL — HYDROCHLOROTHIAZIDE 25 MG T: 25 | 90 days supply | Qty: 90 | Fill #0

## 2019-06-23 MED FILL — LISINOPRIL 10 MG TABS: 10 | 90 days supply | Qty: 90 | Fill #0

## 2019-07-22 DIAGNOSIS — E039 Hypothyroidism, unspecified: Secondary | ICD-10-CM | POA: Diagnosis not present

## 2019-07-22 DIAGNOSIS — E782 Mixed hyperlipidemia: Secondary | ICD-10-CM | POA: Diagnosis not present

## 2019-07-22 DIAGNOSIS — E119 Type 2 diabetes mellitus without complications: Secondary | ICD-10-CM | POA: Diagnosis not present

## 2019-07-28 DIAGNOSIS — E782 Mixed hyperlipidemia: Secondary | ICD-10-CM | POA: Diagnosis not present

## 2019-07-28 DIAGNOSIS — E1122 Type 2 diabetes mellitus with diabetic chronic kidney disease: Secondary | ICD-10-CM | POA: Diagnosis not present

## 2019-07-28 DIAGNOSIS — E559 Vitamin D deficiency, unspecified: Secondary | ICD-10-CM | POA: Diagnosis not present

## 2019-07-28 DIAGNOSIS — E039 Hypothyroidism, unspecified: Secondary | ICD-10-CM | POA: Diagnosis not present

## 2019-07-28 DIAGNOSIS — Z7984 Long term (current) use of oral hypoglycemic drugs: Secondary | ICD-10-CM | POA: Diagnosis not present

## 2019-07-28 DIAGNOSIS — I5189 Other ill-defined heart diseases: Secondary | ICD-10-CM | POA: Diagnosis not present

## 2019-07-28 DIAGNOSIS — I129 Hypertensive chronic kidney disease with stage 1 through stage 4 chronic kidney disease, or unspecified chronic kidney disease: Secondary | ICD-10-CM | POA: Diagnosis not present

## 2019-07-28 DIAGNOSIS — N1831 Chronic kidney disease, stage 3a: Secondary | ICD-10-CM | POA: Diagnosis not present

## 2019-07-28 MED FILL — ROSUVASTATIN CALCIUM 10 MG: 10 | 90 days supply | Qty: 90 | Fill #0

## 2019-08-07 DIAGNOSIS — B353 Tinea pedis: Secondary | ICD-10-CM | POA: Diagnosis not present

## 2019-08-07 DIAGNOSIS — Z23 Encounter for immunization: Secondary | ICD-10-CM | POA: Diagnosis not present

## 2019-08-07 DIAGNOSIS — D2262 Melanocytic nevi of left upper limb, including shoulder: Secondary | ICD-10-CM | POA: Diagnosis not present

## 2019-08-07 DIAGNOSIS — D2330 Other benign neoplasm of skin of unspecified part of face: Secondary | ICD-10-CM | POA: Diagnosis not present

## 2019-08-07 DIAGNOSIS — L821 Other seborrheic keratosis: Secondary | ICD-10-CM | POA: Diagnosis not present

## 2019-08-07 MED FILL — KETOCONAZOLE 2% CREAM: 2 | 30 days supply | Qty: 30 | Fill #0

## 2019-09-23 MED FILL — TRULICITY 1.5 MG/0.5 ML PEN: 1.5 | 84 days supply | Qty: 6 | Fill #1

## 2019-10-05 MED FILL — METOPROLOL SUCCINATE ER 50: 50 | 90 days supply | Qty: 90 | Fill #0

## 2019-10-14 MED FILL — METOPROLOL SUCCINATE ER 50: 50 | 90 days supply | Qty: 90 | Fill #0

## 2019-10-29 MED FILL — metFORMIN HCL ER 500 MG TB2: 500 | 90 days supply | Qty: 180 | Fill #0

## 2019-12-07 DIAGNOSIS — E119 Type 2 diabetes mellitus without complications: Secondary | ICD-10-CM | POA: Diagnosis not present

## 2019-12-07 DIAGNOSIS — E559 Vitamin D deficiency, unspecified: Secondary | ICD-10-CM | POA: Diagnosis not present

## 2019-12-07 DIAGNOSIS — E782 Mixed hyperlipidemia: Secondary | ICD-10-CM | POA: Diagnosis not present

## 2019-12-07 DIAGNOSIS — E039 Hypothyroidism, unspecified: Secondary | ICD-10-CM | POA: Diagnosis not present

## 2019-12-10 DIAGNOSIS — G4733 Obstructive sleep apnea (adult) (pediatric): Secondary | ICD-10-CM | POA: Diagnosis not present

## 2019-12-10 DIAGNOSIS — I129 Hypertensive chronic kidney disease with stage 1 through stage 4 chronic kidney disease, or unspecified chronic kidney disease: Secondary | ICD-10-CM | POA: Diagnosis not present

## 2019-12-10 DIAGNOSIS — E538 Deficiency of other specified B group vitamins: Secondary | ICD-10-CM | POA: Diagnosis not present

## 2019-12-10 DIAGNOSIS — I5189 Other ill-defined heart diseases: Secondary | ICD-10-CM | POA: Diagnosis not present

## 2019-12-10 DIAGNOSIS — E782 Mixed hyperlipidemia: Secondary | ICD-10-CM | POA: Diagnosis not present

## 2019-12-10 DIAGNOSIS — N1831 Chronic kidney disease, stage 3a: Secondary | ICD-10-CM | POA: Diagnosis not present

## 2019-12-10 DIAGNOSIS — I10 Essential (primary) hypertension: Secondary | ICD-10-CM | POA: Diagnosis not present

## 2019-12-10 DIAGNOSIS — E1122 Type 2 diabetes mellitus with diabetic chronic kidney disease: Secondary | ICD-10-CM | POA: Diagnosis not present

## 2019-12-10 DIAGNOSIS — E559 Vitamin D deficiency, unspecified: Secondary | ICD-10-CM | POA: Diagnosis not present

## 2019-12-10 DIAGNOSIS — E039 Hypothyroidism, unspecified: Secondary | ICD-10-CM | POA: Diagnosis not present

## 2019-12-12 MED FILL — ROSUVASTATIN CALCIUM 10 MG: 10 | 90 days supply | Qty: 90 | Fill #0

## 2019-12-12 MED FILL — SYNTHROID 150 MCG TABLET: 150 | 90 days supply | Qty: 90 | Fill #0

## 2019-12-12 MED FILL — EZETIMIBE 10 MG TABS: 10 | 90 days supply | Qty: 90 | Fill #0

## 2019-12-12 MED FILL — TRULICITY 1.5 MG/0.5 ML PEN: 1.5 | 84 days supply | Qty: 6 | Fill #2

## 2020-01-15 MED FILL — HYDROCHLOROTHIAZIDE 25 MG T: 25 | 90 days supply | Qty: 90 | Fill #1

## 2020-03-04 ENCOUNTER — Ambulatory Visit (HOSPITAL_COMMUNITY)
Admission: EM | Admit: 2020-03-04 | Discharge: 2020-03-04 | Disposition: A | Discharge status: Home / Self Care | Payer: 59 | Attending: Physician Assistant | Admitting: Physician Assistant

## 2020-03-04 ENCOUNTER — Encounter (HOSPITAL_COMMUNITY): Payer: Self-pay

## 2020-03-04 ENCOUNTER — Other Ambulatory Visit: Payer: Self-pay

## 2020-03-04 DIAGNOSIS — E538 Deficiency of other specified B group vitamins: Secondary | ICD-10-CM | POA: Diagnosis not present

## 2020-03-04 DIAGNOSIS — R21 Rash and other nonspecific skin eruption: Secondary | ICD-10-CM | POA: Diagnosis not present

## 2020-03-04 DIAGNOSIS — E039 Hypothyroidism, unspecified: Secondary | ICD-10-CM | POA: Diagnosis not present

## 2020-03-04 DIAGNOSIS — E119 Type 2 diabetes mellitus without complications: Secondary | ICD-10-CM | POA: Diagnosis not present

## 2020-03-04 DIAGNOSIS — G44209 Tension-type headache, unspecified, not intractable: Secondary | ICD-10-CM | POA: Diagnosis not present

## 2020-03-04 DIAGNOSIS — E782 Mixed hyperlipidemia: Secondary | ICD-10-CM | POA: Diagnosis not present

## 2020-03-04 MED ORDER — TIZANIDINE HCL 4 MG PO TABS: 2.0000 mg | ORAL_TABLET | Freq: Every day | ORAL | 0 refills | Status: AC

## 2020-03-04 MED ORDER — PREDNISONE 10 MG PO TABS: 20.0000 mg | ORAL_TABLET | Freq: Every day | ORAL | 0 refills | Status: AC

## 2020-03-04 MED ORDER — CETIRIZINE HCL 10 MG PO TABS: 10.0000 mg | ORAL_TABLET | Freq: Every day | ORAL | 0 refills | Status: AC

## 2020-03-04 NOTE — ED Provider Notes (Signed)
[place]    CSN: [id number] Arrival date & time: 03/04/20  1630      History   Chief Complaint Chief Complaint  Patient presents with  . Rash  . Headache    HPI Shelby Allen is a 74 y.o. female.   Patient reports urgent care for evaluation of itchy rash on face.  She reports this started Wednesday evening.  She reports it has been present since then does not have so gotten better or worse.  She has tried Benadryl and Zyrtec without much improvement.  She reports it is mildly itchy.  She is unsure what is caused it.  She works as a Marine scientist in the hospital.  She denies any new changes to mask wear, facial creams or make-ups.  She also reports a headache that is going around her head.  She reports this is 3/10.  Denies visual changes, neurologic changes.  She reports it feels like a hat     Past Medical History:  Diagnosis Date  . Asthma   . Diabetes mellitus   . High cholesterol   . Hypertension   . Thyroid disease     There are no problems to display for this patient.   Past Surgical History:  Procedure Laterality Date  . ABDOMINAL HYSTERECTOMY    . adnoidectomy    . BILATERAL OOPHORECTOMY    . CHOLECYSTECTOMY    . FUNCTIONAL ENDOSCOPIC SINUS SURGERY    . HERNIA REPAIR    . TONSILLECTOMY      OB History   No obstetric history on file.      Home Medications    Prior to Admission medications   Medication Sig Start Date End Date Taking? Authorizing Provider  albuterol (PROVENTIL HFA;VENTOLIN HFA) 108 (90 Base) MCG/ACT inhaler Inhale 1-2 puffs into the lungs every 6 (six) hours as needed for wheezing or shortness of breath. 12/07/17   Wieters, Hallie C, PA-C  ASPIRIN PO Take 81 mg by mouth every morning.     [provider]  cetirizine (ZYRTEC ALLERGY) 10 MG tablet Take 1 tablet (10 mg total) by mouth daily. 03/04/20   Breanne Olvera, Marguerita Beards, PA-C  cholecalciferol (VITAMIN D) 1000 UNITS tablet Take 1,000 Units by mouth daily.      [provider]  Ezetimibe (ZETIA PO) Take 10 mg by mouth every morning.     [provider]  gabapentin (NEURONTIN) 100 MG capsule Take 100 mg by mouth 3 (three) times daily.    [provider]  hydrochlorothiazide (MICROZIDE) 12.5 MG capsule Take 12.5 mg by mouth daily.      [provider]  levothyroxine (SYNTHROID, LEVOTHROID) 125 MCG tablet Take 125 mcg by mouth daily.      [provider]  lisinopril (PRINIVIL,ZESTRIL) 10 MG tablet Take 10 mg by mouth daily.      [provider]  metFORMIN (GLUCOPHAGE) 500 MG tablet Take 500 mg by mouth 2 (two) times daily with a meal.      [provider]  nitrofurantoin, macrocrystal-monohydrate, (MACROBID) 100 MG capsule Take 1 capsule (100 mg total) by mouth 2 (two) times daily. 03/08/17   Lysbeth Penner, FNP  ondansetron (ZOFRAN) 4 MG tablet Take 1 tablet (4 mg total) by mouth every 6 (six) hours. 12/07/17   Wieters, Hallie C, PA-C  phenazopyridine (PYRIDIUM) 200 MG tablet Take 1 tablet (200 mg total) by mouth 3 (three) times daily. 03/08/17   Lysbeth Penner, FNP  predniSONE (DELTASONE) 10 MG  tablet Take 2 tablets (20 mg total) by mouth daily for 3 days. 03/04/20 03/07/20  Alecsander Hattabaugh, Marguerita Beards, PA-C  Rosuvastatin Calcium (CRESTOR PO) Take 10 mg by mouth every morning.     [provider]  simvastatin (ZOCOR) 20 MG tablet Take 20 mg by mouth at bedtime.      [provider]  tiZANidine (ZANAFLEX) 4 MG tablet Take 0.5 tablets (2 mg total) by mouth at bedtime for 7 days. 03/04/20 03/11/20  Diamonte Stavely, Marguerita Beards, PA-C  topiramate (TOPAMAX) 50 MG tablet Take 50 mg by mouth 2 (two) times daily as needed.     [provider]    Family History Family History  Problem Relation Age of Onset  . Stroke Mother   . CAD Mother   . Cancer Mother   . CAD Father   . Cancer Sister     Social History Social History   Tobacco Use  . Smoking status: Never Smoker  . Smokeless tobacco: Never Used    Substance Use Topics  . Alcohol use: No  . Drug use: No     Allergies   Ciprofloxacin and Iodine   Review of Systems Review of Systems   Physical Exam Triage Vital Signs ED Triage Vitals  Enc Vitals Group     BP 03/04/20 1806 (!) 134/59     Pulse Rate 03/04/20 1806 70     Resp 03/04/20 1806 16     Temp 03/04/20 1806 98.3 F (36.8 C)     Temp Source 03/04/20 1806 Oral     SpO2 03/04/20 1806 97 %     Weight --      Height --      Head Circumference --      Peak Flow --      Pain Score 03/04/20 1802 3     Pain Loc --      Pain Edu? --      Excl. in [place]? --    No data found.  Updated Vital Signs BP (!) 134/59 (BP Location: Right Arm)   Pulse 70   Temp 98.3 F (36.8 C) (Oral)   Resp 16   SpO2 97%   Visual Acuity Right Eye Distance:   Left Eye Distance:   Bilateral Distance:    Right Eye Near:   Left Eye Near:    Bilateral Near:     Physical Exam Vitals and nursing note reviewed.  Constitutional:      General: She is not in acute distress.    Appearance: She is well-developed. She is not ill-appearing.  HENT:     Head: Normocephalic and atraumatic.     Mouth/Throat:     Mouth: Mucous membranes are moist.     Pharynx: Oropharynx is clear.  Eyes:     Conjunctiva/sclera: Conjunctivae normal.  Neck:     Comments: Tenderness and tightness through the trapezius musculature Cardiovascular:     Rate and Rhythm: Normal rate and regular rhythm.     Heart sounds: No murmur.  Pulmonary:     Effort: Pulmonary effort is normal. No respiratory distress.     Breath sounds: Normal breath sounds.  Abdominal:     Palpations: Abdomen is soft.     Tenderness: There is no abdominal tenderness.  Musculoskeletal:     Cervical back: Neck supple.  Skin:    General: Skin is warm and dry.     Findings: Rash (Scattered erythematous rash.  It is blanching.  No pustules or vesicular  lesions.) present.  Neurological:     Mental Status: She is alert and oriented to person,  place, and time.     Cranial Nerves: No cranial nerve deficit, dysarthria or facial asymmetry.      UC Treatments / Results  Labs (all labs ordered are listed, but only abnormal results are displayed) Labs Reviewed - No data to display  EKG   Radiology No results found.  Procedures Procedures (including critical care time)  Medications Ordered in UC Medications - No data to display  Initial Impression / Assessment and Plan / UC Course  I have reviewed the triage vital signs and the nursing notes.  Pertinent labs & imaging results that were available during my care of the patient were reviewed by me and considered in my medical decision making (see chart for details).     #Rash #Tension headache Patient 74 year old with rash of unclear etiology on the face.  Given he does have an itch component will start to continue Zyrtec and placed on a short course of low-dose prednisone and have her follow-up with primary care next week for reevaluation.  Believe her headache is tension type.  Recommend Tylenol and low-dose muscle relaxer tonight.  Patient verbalized understanding of plan. Final Clinical Impressions(s) / UC Diagnoses   Final diagnoses:  Rash and nonspecific skin eruption     Discharge Instructions     Try the prednisone  Continue zyrtec  Take tylenol for the headache Take 1/2 of the zanaflex tonight for the headache  Schedule follow up with primary care if rash is not resolving      ED Prescriptions    Medication Sig Dispense Auth. Provider   cetirizine (ZYRTEC ALLERGY) 10 MG tablet Take 1 tablet (10 mg total) by mouth daily. 30 tablet Posie Lillibridge, Marguerita Beards, PA-C   predniSONE (DELTASONE) 10 MG tablet Take 2 tablets (20 mg total) by mouth daily for 3 days. 6 tablet Nubia Ziesmer, Marguerita Beards, PA-C   tiZANidine (ZANAFLEX) 4 MG tablet Take 0.5 tablets (2 mg total) by mouth at bedtime for 7 days. 4 tablet Jenaye Rickert, Marguerita Beards, PA-C     PDMP not reviewed this encounter.   Purnell Shoemaker, PA-C 03/04/20 2111

## 2020-03-04 NOTE — Discharge Instructions (Signed)
Try the prednisone  Continue zyrtec  Take tylenol for the headache Take 1/2 of the zanaflex tonight for the headache  Schedule follow up with primary care if rash is not resolving

## 2020-03-04 NOTE — ED Triage Notes (Signed)
C/o rash on face that started on Wednesday and reports that it tends to be worse by the end of the day. Also reports headache.

## 2020-03-09 DIAGNOSIS — N1831 Chronic kidney disease, stage 3a: Secondary | ICD-10-CM | POA: Diagnosis not present

## 2020-03-09 DIAGNOSIS — E538 Deficiency of other specified B group vitamins: Secondary | ICD-10-CM | POA: Diagnosis not present

## 2020-03-09 DIAGNOSIS — I129 Hypertensive chronic kidney disease with stage 1 through stage 4 chronic kidney disease, or unspecified chronic kidney disease: Secondary | ICD-10-CM | POA: Diagnosis not present

## 2020-03-09 DIAGNOSIS — E782 Mixed hyperlipidemia: Secondary | ICD-10-CM | POA: Diagnosis not present

## 2020-03-09 DIAGNOSIS — E1122 Type 2 diabetes mellitus with diabetic chronic kidney disease: Secondary | ICD-10-CM | POA: Diagnosis not present

## 2020-03-09 DIAGNOSIS — I5189 Other ill-defined heart diseases: Secondary | ICD-10-CM | POA: Diagnosis not present

## 2020-03-09 DIAGNOSIS — E559 Vitamin D deficiency, unspecified: Secondary | ICD-10-CM | POA: Diagnosis not present

## 2020-03-09 DIAGNOSIS — E039 Hypothyroidism, unspecified: Secondary | ICD-10-CM | POA: Diagnosis not present

## 2020-04-08 DIAGNOSIS — H524 Presbyopia: Secondary | ICD-10-CM | POA: Diagnosis not present

## 2020-04-12 MED FILL — LISINOPRIL 10 MG TABS: 10 | 90 days supply | Qty: 90 | Fill #1

## 2020-04-19 DIAGNOSIS — Z0000 Encounter for general adult medical examination without abnormal findings: Secondary | ICD-10-CM | POA: Diagnosis not present

## 2020-04-19 DIAGNOSIS — R928 Other abnormal and inconclusive findings on diagnostic imaging of breast: Secondary | ICD-10-CM | POA: Diagnosis not present

## 2020-04-19 DIAGNOSIS — Z1231 Encounter for screening mammogram for malignant neoplasm of breast: Secondary | ICD-10-CM | POA: Diagnosis not present

## 2020-04-19 DIAGNOSIS — N6489 Other specified disorders of breast: Secondary | ICD-10-CM | POA: Diagnosis not present

## 2020-05-02 MED FILL — LISINOPRIL 10 MG TABS: 10 | 90 days supply | Qty: 90 | Fill #1

## 2020-05-13 MED FILL — TRULICITY 1.5 MG/0.5 ML PEN: 1.5 | 84 days supply | Qty: 6 | Fill #0

## 2020-06-02 MED FILL — METOPROLOL SUCCINATE ER 50: 50 | 90 days supply | Qty: 90 | Fill #0

## 2020-06-03 DIAGNOSIS — E039 Hypothyroidism, unspecified: Secondary | ICD-10-CM | POA: Diagnosis not present

## 2020-06-03 DIAGNOSIS — E782 Mixed hyperlipidemia: Secondary | ICD-10-CM | POA: Diagnosis not present

## 2020-06-03 DIAGNOSIS — E119 Type 2 diabetes mellitus without complications: Secondary | ICD-10-CM | POA: Diagnosis not present

## 2020-06-09 DIAGNOSIS — H52223 Regular astigmatism, bilateral: Secondary | ICD-10-CM | POA: Diagnosis not present

## 2020-06-09 DIAGNOSIS — H5371 Glare sensitivity: Secondary | ICD-10-CM | POA: Diagnosis not present

## 2020-06-09 DIAGNOSIS — H2511 Age-related nuclear cataract, right eye: Secondary | ICD-10-CM | POA: Diagnosis not present

## 2020-06-09 MED FILL — KETOROLAC 0.5% OPHTH SOLN: 0.5 | 33 days supply | Qty: 5 | Fill #0

## 2020-06-09 MED FILL — PREDNISOLONE AC 1% EYE DROP: 1 | 33 days supply | Qty: 5 | Fill #0

## 2020-06-09 MED FILL — MOXIFLOXACIN HCL 0.5 % SOLN: 0.5 | 20 days supply | Qty: 3 | Fill #0

## 2020-06-10 DIAGNOSIS — E538 Deficiency of other specified B group vitamins: Secondary | ICD-10-CM | POA: Diagnosis not present

## 2020-06-10 DIAGNOSIS — N1831 Chronic kidney disease, stage 3a: Secondary | ICD-10-CM | POA: Diagnosis not present

## 2020-06-10 DIAGNOSIS — E119 Type 2 diabetes mellitus without complications: Secondary | ICD-10-CM | POA: Diagnosis not present

## 2020-06-10 DIAGNOSIS — E1122 Type 2 diabetes mellitus with diabetic chronic kidney disease: Secondary | ICD-10-CM | POA: Diagnosis not present

## 2020-06-10 DIAGNOSIS — I129 Hypertensive chronic kidney disease with stage 1 through stage 4 chronic kidney disease, or unspecified chronic kidney disease: Secondary | ICD-10-CM | POA: Diagnosis not present

## 2020-06-10 DIAGNOSIS — E559 Vitamin D deficiency, unspecified: Secondary | ICD-10-CM | POA: Diagnosis not present

## 2020-06-10 DIAGNOSIS — E039 Hypothyroidism, unspecified: Secondary | ICD-10-CM | POA: Diagnosis not present

## 2020-06-10 DIAGNOSIS — E782 Mixed hyperlipidemia: Secondary | ICD-10-CM | POA: Diagnosis not present

## 2020-06-10 MED FILL — TRULICITY 3 MG/0.5ML SOPN: 3 | 84 days supply | Qty: 6 | Fill #0

## 2020-06-21 DIAGNOSIS — Z1239 Encounter for other screening for malignant neoplasm of breast: Secondary | ICD-10-CM | POA: Diagnosis not present

## 2020-06-22 DIAGNOSIS — H52221 Regular astigmatism, right eye: Secondary | ICD-10-CM | POA: Diagnosis not present

## 2020-06-22 DIAGNOSIS — H25811 Combined forms of age-related cataract, right eye: Secondary | ICD-10-CM | POA: Diagnosis not present

## 2020-06-22 DIAGNOSIS — H2511 Age-related nuclear cataract, right eye: Secondary | ICD-10-CM | POA: Diagnosis not present

## 2020-07-13 MED FILL — SYNTHROID 150 MCG TABLET: 150 | 90 days supply | Qty: 90 | Fill #1

## 2020-07-19 MED FILL — HYDROCHLOROTHIAZIDE 25 MG T: 25 | 90 days supply | Qty: 90 | Fill #0

## 2021-05-17 ENCOUNTER — Encounter (HOSPITAL_COMMUNITY): Payer: Self-pay

## 2021-05-17 ENCOUNTER — Ambulatory Visit (HOSPITAL_COMMUNITY)
Admission: EM | Admit: 2021-05-17 | Discharge: 2021-05-17 | Disposition: A | Discharge status: Home / Self Care | Payer: Medicare Other | Attending: Medical Oncology | Admitting: Medical Oncology

## 2021-05-17 ENCOUNTER — Other Ambulatory Visit: Payer: Self-pay

## 2021-05-17 DIAGNOSIS — N3001 Acute cystitis with hematuria: Secondary | ICD-10-CM | POA: Insufficient documentation

## 2021-05-17 LAB — POCT URINALYSIS DIPSTICK, ED / UC
Bilirubin Urine: NEGATIVE
Glucose, UA: NEGATIVE mg/dL
Ketones, ur: NEGATIVE mg/dL
Nitrite: POSITIVE — AB
Protein, ur: 100 mg/dL — AB
Specific Gravity, Urine: 1.025 (ref 1.005–1.030)
Urobilinogen, UA: 1 mg/dL (ref 0.0–1.0)
pH: 5.5 (ref 5.0–8.0)

## 2021-05-17 MED ORDER — CEPHALEXIN 500 MG PO CAPS: 500.0000 mg | ORAL_CAPSULE | Freq: Four times a day (QID) | ORAL | 0 refills | Status: AC

## 2021-05-17 NOTE — ED Provider Notes (Signed)
[facility] [place]    CSN: [id number] Arrival date & time: 05/17/21  1111      History   Chief Complaint Chief Complaint  Patient presents with   Dysuria    HPI Shelby Allen is a 75 y.o. female.   HPI  Dysuria: Pt reports a 1 week history of dysuria, urinary frequency. She believes this is from holding her urine while on a vacation recently. Feel similar to her one past UTI that she had many years ago. She denies fevers, vomiting, abdominal pain. She has tried to stay hydrated for symptoms with some relief.   Past Medical History:  Diagnosis Date   Asthma    Diabetes mellitus    High cholesterol    Hypertension    Thyroid disease     There are no problems to display for this patient.   Past Surgical History:  Procedure Laterality Date   ABDOMINAL HYSTERECTOMY     adnoidectomy     BILATERAL OOPHORECTOMY     CHOLECYSTECTOMY     FUNCTIONAL ENDOSCOPIC SINUS SURGERY     HERNIA REPAIR     TONSILLECTOMY      OB History   No obstetric history on file.      Home Medications    Prior to Admission medications   Medication Sig Start Date End Date Taking? Authorizing Provider  Dulaglutide (TRULICITY) 3 0000000 SOPN Inject 3 mg weekly for diabetes 06/10/20  Yes [provider]  metoprolol succinate (TOPROL-XL) 50 MG 24 hr tablet Take 1 tablet once a day for blood pressure 06/14/15  Yes [provider]  albuterol (PROVENTIL HFA;VENTOLIN HFA) 108 (90 Base) MCG/ACT inhaler Inhale 1-2 puffs into the lungs every 6 (six) hours as needed for wheezing or shortness of breath. 12/07/17   Wieters, Hallie C, PA-C  ASPIRIN PO Take 81 mg by mouth every morning.     [provider]  cetirizine (ZYRTEC ALLERGY) 10 MG tablet Take 1 tablet (10 mg total) by mouth daily. 03/04/20   Darr, Edison Nasuti, PA-C  cholecalciferol (VITAMIN D) 1000 UNITS tablet Take 1,000 Units by mouth daily.      [provider]  cyanocobalamin 1000 MCG tablet Take 1 tablet by  mouth daily.    [provider]  Ezetimibe (ZETIA PO) Take 10 mg by mouth every morning.     [provider]  gabapentin (NEURONTIN) 100 MG capsule Take 100 mg by mouth 3 (three) times daily.    [provider]  hydrochlorothiazide (MICROZIDE) 12.5 MG capsule Take 12.5 mg by mouth daily.      [provider]  levothyroxine (SYNTHROID, LEVOTHROID) 125 MCG tablet Take 125 mcg by mouth daily.      [provider]  lisinopril (PRINIVIL,ZESTRIL) 10 MG tablet Take 10 mg by mouth daily.      [provider]  metFORMIN (GLUCOPHAGE) 500 MG tablet Take 500 mg by mouth 2 (two) times daily with a meal.      [provider]  nitrofurantoin, macrocrystal-monohydrate, (MACROBID) 100 MG capsule Take 1 capsule (100 mg total) by mouth 2 (two) times daily. 03/08/17   Lysbeth Penner, FNP  ondansetron (ZOFRAN) 4 MG tablet Take 1 tablet (4 mg total) by mouth every 6 (six) hours. 12/07/17   Wieters, Hallie C, PA-C  phenazopyridine (PYRIDIUM) 200 MG tablet Take 1 tablet (200 mg total) by mouth 3 (three) times daily. 03/08/17   Lysbeth Penner, FNP  Rosuvastatin Calcium (CRESTOR PO) Take 10 mg by  mouth every morning.     [provider]  simvastatin (ZOCOR) 20 MG tablet Take 20 mg by mouth at bedtime.      [provider]  topiramate (TOPAMAX) 50 MG tablet Take 50 mg by mouth 2 (two) times daily as needed.     [provider]    Family History Family History  Problem Relation Age of Onset   Stroke Mother    CAD Mother    Cancer Mother    CAD Father    Cancer Sister     Social History Social History   Tobacco Use   Smoking status: Never   Smokeless tobacco: Never  Vaping Use   Vaping Use: Never used  Substance Use Topics   Alcohol use: No   Drug use: No     Allergies   Ciprofloxacin and Iodine   Review of Systems Review of Systems  As stated above in HPI Physical Exam Triage Vital Signs ED Triage Vitals   Enc Vitals Group     BP 05/17/21 1141 132/76     Pulse Rate 05/17/21 1141 74     Resp --      Temp 05/17/21 1141 98.3 F (36.8 C)     Temp Source 05/17/21 1141 Oral     SpO2 05/17/21 1141 97 %     Weight --      Height --      Head Circumference --      Peak Flow --      Pain Score 05/17/21 1136 0     Pain Loc --      Pain Edu? --      Excl. in [place]? --    No data found.  Updated Vital Signs BP 132/76 (BP Location: Right Arm)   Pulse 74   Temp 98.3 F (36.8 C) (Oral)   SpO2 97%   Physical Exam Vitals and nursing note reviewed.  Constitutional:      General: She is not in acute distress.    Appearance: Normal appearance. She is not ill-appearing, toxic-appearing or diaphoretic.  HENT:     Head: Normocephalic and atraumatic.  Cardiovascular:     Rate and Rhythm: Normal rate and regular rhythm.     Heart sounds: Normal heart sounds.  Pulmonary:     Effort: Pulmonary effort is normal.     Breath sounds: Normal breath sounds.  Abdominal:     General: Bowel sounds are normal. There is no distension.     Palpations: Abdomen is soft. There is no mass.     Tenderness: There is no abdominal tenderness. There is no right CVA tenderness, left CVA tenderness, guarding or rebound.     Hernia: No hernia is present.  Neurological:     Mental Status: She is alert and oriented to person, place, and time.     UC Treatments / Results  Labs (all labs ordered are listed, but only abnormal results are displayed) Labs Reviewed  POCT URINALYSIS DIPSTICK, ED / UC - Abnormal; Notable for the following components:      Result Value   Hgb urine dipstick MODERATE (*)    Protein, ur 100 (*)    Nitrite POSITIVE (*)    Leukocytes,Ua LARGE (*)    All other components within normal limits    EKG   Radiology No results found.  Procedures Procedures (including critical care time)  Medications Ordered in UC Medications - No data to display  Initial Impression / Assessment and  Plan  / UC Course  I have reviewed the triage vital signs and the nursing notes.  Pertinent labs & imaging results that were available during my care of the patient were reviewed by me and considered in my medical decision making (see chart for details).     New.  No recent UTIs on file and no recent hospitalizations.  Treating with Keflex for acute cystitis with hematuria to prevent further systemic illness.  We will culture her urine to ensure that antibiotic is correct.  Discussed red flags and symptoms.  Hydration with water encouraged. Final Clinical Impressions(s) / UC Diagnoses   Final diagnoses:  None   Discharge Instructions   None    ED Prescriptions   None    PDMP not reviewed this encounter.   Hughie Closs, [place] 05/17/21 1217

## 2021-05-17 NOTE — ED Triage Notes (Signed)
Pt reports increased urinary frequency and burning  when urinating  x 1 week.

## 2021-05-19 LAB — URINE CULTURE: Culture: >=100000 — AB

## 2021-07-04 ENCOUNTER — Other Ambulatory Visit (HOSPITAL_COMMUNITY): Payer: Self-pay

## 2021-07-07 ENCOUNTER — Other Ambulatory Visit (HOSPITAL_COMMUNITY): Payer: Self-pay

## 2021-07-07 MED ORDER — TRULICITY 3 MG/0.5ML ~~LOC~~ SOAJ
SUBCUTANEOUS | 3 refills | Status: AC
  Filled 2021-07-07: qty 6, 84d supply, fill #0
  Filled 2021-07-10: qty 2, 28d supply, fill #0
  Filled 2021-07-10: qty 6, 84d supply, fill #0
  Filled 2021-08-30: qty 2, 28d supply, fill #0
  Filled 2021-08-30: qty 2, 28d supply, fill #1
  Filled 2021-10-23 – 2021-10-24 (×2): qty 2, 28d supply, fill #0

## 2021-07-10 ENCOUNTER — Other Ambulatory Visit (HOSPITAL_COMMUNITY): Payer: Self-pay

## 2021-07-15 ENCOUNTER — Other Ambulatory Visit (HOSPITAL_COMMUNITY): Payer: Self-pay

## 2021-08-09 ENCOUNTER — Other Ambulatory Visit (HOSPITAL_COMMUNITY): Payer: Self-pay

## 2021-08-09 MED ORDER — KETOCONAZOLE 2 % EX CREA
TOPICAL_CREAM | CUTANEOUS | 0 refills | Status: AC
  Filled 2021-08-09 – 2021-08-30 (×2): qty 60, 30d supply, fill #0

## 2021-08-17 ENCOUNTER — Other Ambulatory Visit (HOSPITAL_COMMUNITY): Payer: Self-pay

## 2021-08-30 ENCOUNTER — Other Ambulatory Visit (HOSPITAL_COMMUNITY): Payer: Self-pay

## 2021-08-31 ENCOUNTER — Other Ambulatory Visit (HOSPITAL_COMMUNITY): Payer: Self-pay

## 2021-09-01 ENCOUNTER — Other Ambulatory Visit (HOSPITAL_COMMUNITY): Payer: Self-pay

## 2021-09-04 ENCOUNTER — Other Ambulatory Visit (HOSPITAL_COMMUNITY): Payer: Self-pay

## 2021-09-08 ENCOUNTER — Other Ambulatory Visit (HOSPITAL_COMMUNITY): Payer: Self-pay

## 2021-09-11 ENCOUNTER — Other Ambulatory Visit (HOSPITAL_COMMUNITY): Payer: Self-pay

## 2021-10-23 ENCOUNTER — Other Ambulatory Visit (HOSPITAL_COMMUNITY): Payer: Self-pay

## 2021-10-24 ENCOUNTER — Other Ambulatory Visit (HOSPITAL_COMMUNITY): Payer: Self-pay

## 2021-11-21 ENCOUNTER — Other Ambulatory Visit (HOSPITAL_COMMUNITY): Payer: Self-pay

## 2021-11-21 MED ORDER — EMPAGLIFLOZIN 10 MG PO TABS
ORAL_TABLET | ORAL | 4 refills | Status: DC
  Filled 2021-11-21: qty 90, 90d supply, fill #0
  Filled 2021-12-08: qty 30, 30d supply, fill #0
  Filled 2022-02-16: qty 30, 30d supply, fill #1
  Filled 2022-04-16: qty 30, 30d supply, fill #2
  Filled 2022-07-02: qty 30, 30d supply, fill #3
  Filled 2022-07-27 – 2022-10-16 (×3): qty 30, 30d supply, fill #4

## 2021-11-21 MED ORDER — TRULICITY 3 MG/0.5ML ~~LOC~~ SOAJ
3.0000 mg | SUBCUTANEOUS | 4 refills | Status: AC
  Filled 2021-11-21: qty 2, 28d supply, fill #0
  Filled 2021-12-08 – 2021-12-18 (×2): qty 6, 84d supply, fill #0
  Filled 2021-12-30: qty 2, 28d supply, fill #0

## 2021-11-29 ENCOUNTER — Other Ambulatory Visit (HOSPITAL_COMMUNITY): Payer: Self-pay

## 2021-12-01 ENCOUNTER — Other Ambulatory Visit (HOSPITAL_COMMUNITY): Payer: Self-pay

## 2021-12-08 ENCOUNTER — Other Ambulatory Visit (HOSPITAL_COMMUNITY): Payer: Self-pay

## 2021-12-18 ENCOUNTER — Other Ambulatory Visit (HOSPITAL_COMMUNITY): Payer: Self-pay

## 2021-12-30 ENCOUNTER — Other Ambulatory Visit (HOSPITAL_COMMUNITY): Payer: Self-pay

## 2022-02-16 ENCOUNTER — Other Ambulatory Visit (HOSPITAL_COMMUNITY): Payer: Self-pay

## 2022-04-16 ENCOUNTER — Other Ambulatory Visit (HOSPITAL_COMMUNITY): Payer: Self-pay

## 2022-07-02 ENCOUNTER — Other Ambulatory Visit (HOSPITAL_COMMUNITY): Payer: Self-pay

## 2022-07-27 ENCOUNTER — Other Ambulatory Visit (HOSPITAL_COMMUNITY): Payer: Self-pay

## 2022-07-27 MED ORDER — TRULICITY 1.5 MG/0.5ML ~~LOC~~ SOAJ
1.5000 mg | SUBCUTANEOUS | 4 refills | Status: AC
  Filled 2023-06-27 (×2): qty 2, 28d supply, fill #0

## 2022-07-27 MED ORDER — TRULICITY 1.5 MG/0.5ML ~~LOC~~ SOAJ
1.5000 mg | SUBCUTANEOUS | 4 refills | Status: AC
  Filled 2022-11-22: qty 2, 28d supply, fill #0
  Filled 2023-01-09 – 2023-01-15 (×2): qty 2, 28d supply, fill #1
  Filled 2023-02-05: qty 2, 28d supply, fill #2
  Filled 2023-05-03: qty 2, 28d supply, fill #3
  Filled 2023-06-12 – 2023-07-24 (×2): qty 2, 28d supply, fill #4

## 2022-07-27 MED ORDER — TRULICITY 0.75 MG/0.5ML ~~LOC~~ SOAJ
0.7500 mg | SUBCUTANEOUS | 0 refills | Status: AC
  Filled 2022-07-27 – 2022-10-16 (×3): qty 2, 28d supply, fill #0

## 2022-08-08 ENCOUNTER — Other Ambulatory Visit (HOSPITAL_COMMUNITY): Payer: Self-pay

## 2022-10-16 ENCOUNTER — Other Ambulatory Visit (HOSPITAL_COMMUNITY): Payer: Self-pay

## 2022-10-17 ENCOUNTER — Other Ambulatory Visit (HOSPITAL_COMMUNITY): Payer: Self-pay

## 2022-11-22 ENCOUNTER — Other Ambulatory Visit (HOSPITAL_COMMUNITY): Payer: Self-pay

## 2022-11-26 ENCOUNTER — Other Ambulatory Visit (HOSPITAL_COMMUNITY): Payer: Self-pay

## 2022-11-26 MED ORDER — JARDIANCE 10 MG PO TABS
10.0000 mg | ORAL_TABLET | Freq: Every day | ORAL | 4 refills | Status: AC
  Filled 2022-11-26: qty 90, 90d supply, fill #0

## 2022-12-04 ENCOUNTER — Other Ambulatory Visit (HOSPITAL_COMMUNITY): Payer: Self-pay

## 2023-01-08 ENCOUNTER — Other Ambulatory Visit (HOSPITAL_COMMUNITY): Payer: Self-pay

## 2023-01-08 MED ORDER — COLESTIPOL HCL 1 G PO TABS
1.0000 g | ORAL_TABLET | Freq: Two times a day (BID) | ORAL | 3 refills | Status: AC
  Filled 2023-01-08: qty 60, 30d supply, fill #0
  Filled 2023-01-12: qty 60, 30d supply, fill #1
  Filled 2023-05-03: qty 60, 30d supply, fill #2
  Filled 2023-09-05: qty 60, 30d supply, fill #3

## 2023-01-08 MED ORDER — DICYCLOMINE HCL 10 MG PO CAPS
ORAL_CAPSULE | ORAL | 3 refills | Status: AC
  Filled 2023-01-08: qty 90, 30d supply, fill #0
  Filled 2023-01-12: qty 90, 30d supply, fill #1
  Filled 2023-02-05: qty 90, 30d supply, fill #2
  Filled 2023-09-05: qty 90, 30d supply, fill #3

## 2023-01-09 ENCOUNTER — Other Ambulatory Visit (HOSPITAL_COMMUNITY): Payer: Self-pay

## 2023-01-12 ENCOUNTER — Other Ambulatory Visit (HOSPITAL_COMMUNITY): Payer: Self-pay

## 2023-01-15 ENCOUNTER — Other Ambulatory Visit (HOSPITAL_COMMUNITY): Payer: Self-pay

## 2023-01-15 ENCOUNTER — Other Ambulatory Visit: Payer: Self-pay

## 2023-02-01 ENCOUNTER — Other Ambulatory Visit (HOSPITAL_COMMUNITY): Payer: Self-pay

## 2023-02-05 ENCOUNTER — Other Ambulatory Visit (HOSPITAL_COMMUNITY): Payer: Self-pay

## 2023-02-06 ENCOUNTER — Other Ambulatory Visit (HOSPITAL_COMMUNITY): Payer: Self-pay

## 2023-05-03 ENCOUNTER — Other Ambulatory Visit (HOSPITAL_COMMUNITY): Payer: Self-pay

## 2023-06-12 ENCOUNTER — Other Ambulatory Visit (HOSPITAL_COMMUNITY): Payer: Self-pay

## 2023-06-21 ENCOUNTER — Other Ambulatory Visit (HOSPITAL_COMMUNITY): Payer: Self-pay

## 2023-06-27 ENCOUNTER — Other Ambulatory Visit (HOSPITAL_COMMUNITY): Payer: Self-pay

## 2023-07-24 ENCOUNTER — Other Ambulatory Visit (HOSPITAL_COMMUNITY): Payer: Self-pay

## 2023-07-25 ENCOUNTER — Other Ambulatory Visit (HOSPITAL_COMMUNITY): Payer: Self-pay

## 2023-08-07 ENCOUNTER — Other Ambulatory Visit: Payer: Self-pay

## 2023-08-07 ENCOUNTER — Other Ambulatory Visit (HOSPITAL_COMMUNITY): Payer: Self-pay

## 2023-08-07 MED ORDER — OZEMPIC (0.25 OR 0.5 MG/DOSE) 2 MG/3ML ~~LOC~~ SOPN
PEN_INJECTOR | SUBCUTANEOUS | 11 refills | Status: DC
  Filled 2023-08-07: qty 3, 42d supply, fill #0
  Filled 2023-09-05: qty 3, 28d supply, fill #0

## 2023-08-07 MED ORDER — LEVOTHYROXINE SODIUM 150 MCG PO TABS
150.0000 ug | ORAL_TABLET | Freq: Every day | ORAL | 3 refills | Status: AC
  Filled 2023-08-07: qty 90, 90d supply, fill #0

## 2023-08-22 ENCOUNTER — Other Ambulatory Visit (HOSPITAL_COMMUNITY): Payer: Self-pay

## 2023-09-05 ENCOUNTER — Other Ambulatory Visit (HOSPITAL_COMMUNITY): Payer: Self-pay

## 2023-09-30 ENCOUNTER — Other Ambulatory Visit (HOSPITAL_COMMUNITY): Payer: Self-pay

## 2023-11-05 ENCOUNTER — Other Ambulatory Visit (HOSPITAL_COMMUNITY): Payer: Self-pay

## 2024-01-08 ENCOUNTER — Other Ambulatory Visit (HOSPITAL_COMMUNITY): Payer: Self-pay

## 2024-01-08 ENCOUNTER — Other Ambulatory Visit: Payer: Self-pay

## 2024-01-08 MED ORDER — LEVOTHYROXINE SODIUM 200 MCG PO TABS
200.0000 ug | ORAL_TABLET | Freq: Every morning | ORAL | 3 refills | Status: DC
  Filled 2024-01-08: qty 90, 90d supply, fill #0

## 2024-07-10 ENCOUNTER — Ambulatory Visit (HOSPITAL_COMMUNITY)
Admission: EM | Admit: 2024-07-10 | Discharge: 2024-07-10 | Disposition: A | Discharge status: Home / Self Care | Attending: Family Medicine | Admitting: Family Medicine

## 2024-07-10 ENCOUNTER — Encounter (HOSPITAL_COMMUNITY): Payer: Self-pay

## 2024-07-10 DIAGNOSIS — W57XXXA Bitten or stung by nonvenomous insect and other nonvenomous arthropods, initial encounter: Secondary | ICD-10-CM

## 2024-07-10 DIAGNOSIS — L03115 Cellulitis of right lower limb: Secondary | ICD-10-CM | POA: Diagnosis not present

## 2024-07-10 MED ORDER — CEPHALEXIN 500 MG PO CAPS: 500.0000 mg | ORAL_CAPSULE | Freq: Three times a day (TID) | ORAL | 0 refills | Status: AC

## 2024-07-10 NOTE — ED Provider Notes (Signed)
 [facility]    CSN: [id number] Arrival date & time: 07/10/24  0946      History   Chief Complaint Chief Complaint  Patient presents with   insect bites    HPI Shelby Allen is a 78 y.o. female.   Patient is here for insect bites x 4 days.  She did walk through some weeds, even with long pants, and thinks she was bitten by insects.  Bites are up the legs and one to the belly button.  She thinks some are looking infected.  She is a diabetic.  She has been using a topical cream for chiggers,  which has been helpful overall.  She did use abx ointment without help.        Past Medical History:  Diagnosis Date   Asthma    Diabetes mellitus    High cholesterol    Hypertension    Thyroid  disease     There are no active problems to display for this patient.   Past Surgical History:  Procedure Laterality Date   ABDOMINAL HYSTERECTOMY     adnoidectomy     BILATERAL OOPHORECTOMY     CHOLECYSTECTOMY     FUNCTIONAL ENDOSCOPIC SINUS SURGERY     HERNIA REPAIR     TONSILLECTOMY      OB History   No obstetric history on file.      Home Medications    Prior to Admission medications   Medication Sig Start Date End Date Taking? Authorizing Provider  albuterol  (PROVENTIL  HFA;VENTOLIN  HFA) 108 (90 Base) MCG/ACT inhaler Inhale 1-2 puffs into the lungs every 6 (six) hours as needed for wheezing or shortness of breath. 12/07/17   Wieters, Hallie C, PA-C  ASPIRIN PO Take 81 mg by mouth every morning.     [provider]  cetirizine  (ZYRTEC  ALLERGY) 10 MG tablet Take 1 tablet (10 mg total) by mouth daily. 03/04/20   Darr, Jacob, PA-C  cholecalciferol (VITAMIN D) 1000 UNITS tablet Take 1,000 Units by mouth daily.      [provider]  colestipol  (COLESTID ) 1 g tablet Take 1 tablet (1 g total) by mouth 2 (two) times daily. 01/08/23     cyanocobalamin 1000 MCG tablet Take 1 tablet by mouth daily.    [provider]  dicyclomine  (BENTYL ) 10 MG  capsule Take 1 capsule (10 mg total) by mouth 3 (three) times a day as needed (abdominal cramping). Patient not taking: Reported on 07/10/2024 01/08/23     Dulaglutide  (TRULICITY ) 0.75 MG/0.5ML SOPN Inject 0.5 mLs (0.75 mg total) into the skin once a week. Patient not taking: Reported on 07/10/2024 07/27/22     Dulaglutide  (TRULICITY ) 1.5 MG/0.5ML SOPN Inject 1.5 mg into the skin once a week. Patient not taking: Reported on 07/10/2024 07/27/22     Dulaglutide  (TRULICITY ) 1.5 MG/0.5ML SOPN Inject 1.5 mg into the skin every 7 (seven) days. 07/27/22     Dulaglutide  (TRULICITY ) 3 MG/0.5ML SOPN Inject 3 mg weekly for diabetes 06/10/20   [provider]  Dulaglutide  (TRULICITY ) 3 MG/0.5ML SOPN Inject 1 pen (3 mg) under the skin weekly for diabetes 07/07/21     Dulaglutide  (TRULICITY ) 3 MG/0.5ML SOPN Inject 3 mg into the skin once a week for diabetes 11/21/21     empagliflozin  (JARDIANCE ) 10 MG TABS tablet Take 1 tablet (10 mg total) by mouth daily. Patient not taking: Reported on 07/10/2024 11/26/22     Ezetimibe (ZETIA PO) Take 10 mg by mouth every morning.  Patient  not taking: Reported on 07/10/2024    [provider]  gabapentin (NEURONTIN) 100 MG capsule Take 100 mg by mouth 3 (three) times daily. Patient not taking: Reported on 07/10/2024    [provider]  hydrochlorothiazide (MICROZIDE) 12.5 MG capsule Take 12.5 mg by mouth daily.   Patient not taking: Reported on 07/10/2024    [provider]  ketoconazole  (NIZORAL ) 2 % cream Apply topically 2 times a day Patient not taking: Reported on 07/10/2024 08/09/21     levothyroxine  (SYNTHROID ) 150 MCG tablet Take 1 tablet (150 mcg total) by mouth daily. 08/07/23     levothyroxine  (SYNTHROID , LEVOTHROID) 125 MCG tablet Take 125 mcg by mouth daily.      [provider]  lisinopril (PRINIVIL,ZESTRIL) 10 MG tablet Take 10 mg by mouth daily.      [provider]  metFORMIN (GLUCOPHAGE) 500 MG tablet Take 500 mg by  mouth 2 (two) times daily with a meal.   Patient not taking: Reported on 07/10/2024    [provider]  metoprolol succinate (TOPROL-XL) 50 MG 24 hr tablet Take 1 tablet once a day for blood pressure Patient not taking: Reported on 07/10/2024 06/14/15   [provider]  nitrofurantoin , macrocrystal-monohydrate, (MACROBID ) 100 MG capsule Take 1 capsule (100 mg total) by mouth 2 (two) times daily. Patient not taking: Reported on 07/10/2024 03/08/17   Pennie Elsie PARAS, FNP  ondansetron  (ZOFRAN ) 4 MG tablet Take 1 tablet (4 mg total) by mouth every 6 (six) hours. 12/07/17   Wieters, Hallie C, PA-C  phenazopyridine  (PYRIDIUM ) 200 MG tablet Take 1 tablet (200 mg total) by mouth 3 (three) times daily. Patient not taking: Reported on 07/10/2024 03/08/17   Pennie Elsie PARAS, FNP  Rosuvastatin Calcium (CRESTOR PO) Take 10 mg by mouth every morning.     [provider]  simvastatin (ZOCOR) 20 MG tablet Take 20 mg by mouth at bedtime.   Patient not taking: Reported on 07/10/2024    [provider]  topiramate (TOPAMAX) 50 MG tablet Take 50 mg by mouth 2 (two) times daily as needed.  Patient not taking: Reported on 07/10/2024    [provider]    Family History Family History  Problem Relation Age of Onset   Stroke Mother    CAD Mother    Cancer Mother    CAD Father    Cancer Sister     Social History Social History   Tobacco Use   Smoking status: Never   Smokeless tobacco: Never  Vaping Use   Vaping status: Never Used  Substance Use Topics   Alcohol use: No   Drug use: No     Allergies   Ciprofloxacin and Iodine   Review of Systems Review of Systems  Constitutional: Negative.   HENT: Negative.    Respiratory: Negative.    Cardiovascular: Negative.   Gastrointestinal: Negative.   Skin:  Positive for wound.     Physical Exam Triage Vital Signs ED Triage Vitals  Encounter Vitals Group     BP 07/10/24 1019 (!) 173/80     Girls Systolic  BP Percentile --      Girls Diastolic BP Percentile --      Boys Systolic BP Percentile --      Boys Diastolic BP Percentile --      Pulse Rate 07/10/24 1019 74     Resp 07/10/24 1019 16     Temp 07/10/24 1019 97.7 F (36.5 C)     Temp  Source 07/10/24 1019 Oral     SpO2 07/10/24 1019 95 %     Weight --      Height --      Head Circumference --      Peak Flow --      Pain Score 07/10/24 1018 5     Pain Loc --      Pain Education --      Exclude from Growth Chart --    No data found.  Updated Vital Signs BP (!) 173/80 (BP Location: Left Arm)   Pulse 74   Temp 97.7 F (36.5 C) (Oral)   Resp 16   SpO2 95%   Visual Acuity Right Eye Distance:   Left Eye Distance:   Bilateral Distance:    Right Eye Near:   Left Eye Near:    Bilateral Near:     Physical Exam Constitutional:      Appearance: Normal appearance.  Skin:    Comments: She has multiple small bites to the legs, and one at the umbilicus.  There is one bite to the back of the right leg with surrounding redness/warmth;  about 6cm x 5.5 cm.  No drainage, no abscess noted.   Neurological:     General: No focal deficit present.     Mental Status: She is alert.  Psychiatric:        Mood and Affect: Mood normal.      UC Treatments / Results  Labs (all labs ordered are listed, but only abnormal results are displayed) Labs Reviewed - No data to display  EKG   Radiology No results found.  Procedures Procedures (including critical care time)  Medications Ordered in UC Medications - No data to display  Initial Impression / Assessment and Plan / UC Course  I have reviewed the triage vital signs and the nursing notes.  Pertinent labs & imaging results that were available during my care of the patient were reviewed by me and considered in my medical decision making (see chart for details).   Final Clinical Impressions(s) / UC Diagnoses   Final diagnoses:  Cellulitis of right lower extremity  Insect  bite, unspecified site, initial encounter     Discharge Instructions      You were seen today for infected bug bites.  I have sent out keflex  to treat for possible cellulitis.  You can continue to use the cream for your bug bites for comfort.  Please return if not improving or worsening despite treatment.    ED Prescriptions     Medication Sig Dispense Auth. Provider   cephALEXin  (KEFLEX ) 500 MG capsule Take 1 capsule (500 mg total) by mouth 3 (three) times daily for 7 days. 21 capsule Darral Longs, MD      PDMP not reviewed this encounter.   Darral Longs, MD 07/10/24 1046

## 2024-07-10 NOTE — Discharge Instructions (Addendum)
 You were seen today for infected bug bites.  I have sent out keflex  to treat for possible cellulitis.  You can continue to use the cream for your bug bites for comfort.  Please return if not improving or worsening despite treatment.

## 2024-07-10 NOTE — ED Triage Notes (Addendum)
 Patient states she has bug bites to both legs and one onher lower abdomen x 4 days. A few of of her bites area red and swollen. Patient states she walked through some weeds 4 days ago.  Patient has been using chigger medicine with some relief of itching.
